# Patient Record
Sex: Male | Born: 1950
Health system: Southern US, Community
[De-identification: ages and names within clinical notes are randomized; demographics above are authoritative.]

## PROBLEM LIST (undated history)

## (undated) DIAGNOSIS — E119 Type 2 diabetes mellitus without complications: Secondary | ICD-10-CM

## (undated) DIAGNOSIS — T8859XA Other complications of anesthesia, initial encounter: Secondary | ICD-10-CM

## (undated) DIAGNOSIS — I10 Essential (primary) hypertension: Secondary | ICD-10-CM

## (undated) DIAGNOSIS — E7800 Pure hypercholesterolemia, unspecified: Secondary | ICD-10-CM

## (undated) DIAGNOSIS — J45909 Unspecified asthma, uncomplicated: Secondary | ICD-10-CM

## (undated) DIAGNOSIS — B1920 Unspecified viral hepatitis C without hepatic coma: Secondary | ICD-10-CM

## (undated) DIAGNOSIS — T4145XA Adverse effect of unspecified anesthetic, initial encounter: Secondary | ICD-10-CM

## (undated) DIAGNOSIS — K219 Gastro-esophageal reflux disease without esophagitis: Secondary | ICD-10-CM

## (undated) DIAGNOSIS — G4730 Sleep apnea, unspecified: Secondary | ICD-10-CM

## (undated) HISTORY — PX: OTHER SURGICAL HISTORY: SHX169

## (undated) HISTORY — DX: Gastro-esophageal reflux disease without esophagitis: K21.9

## (undated) HISTORY — PX: COLONOSCOPY: SHX174

## (undated) HISTORY — PX: CHOLECYSTECTOMY: SHX55

## (undated) HISTORY — DX: Unspecified asthma, uncomplicated: J45.909

## (undated) HISTORY — PX: BACK SURGERY: SHX140

## (undated) HISTORY — DX: Type 2 diabetes mellitus without complications: E11.9

---
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
## 2016-01-27 DIAGNOSIS — K7460 Unspecified cirrhosis of liver: Secondary | ICD-10-CM | POA: Insufficient documentation

## 2016-01-27 DIAGNOSIS — K8020 Calculus of gallbladder without cholecystitis without obstruction: Secondary | ICD-10-CM | POA: Insufficient documentation

## 2017-07-13 ENCOUNTER — Emergency Department (HOSPITAL_COMMUNITY)
Admission: EM | Admit: 2017-07-13 | Discharge: 2017-07-13 | Disposition: A | Discharge status: Home / Self Care | Payer: Medicare Other | Attending: Emergency Medicine | Admitting: Emergency Medicine

## 2017-07-13 ENCOUNTER — Encounter (HOSPITAL_COMMUNITY): Payer: Self-pay

## 2017-07-13 ENCOUNTER — Other Ambulatory Visit: Payer: Self-pay

## 2017-07-13 ENCOUNTER — Emergency Department (HOSPITAL_COMMUNITY): Payer: Medicare Other

## 2017-07-13 DIAGNOSIS — J9801 Acute bronchospasm: Secondary | ICD-10-CM | POA: Diagnosis not present

## 2017-07-13 DIAGNOSIS — I10 Essential (primary) hypertension: Secondary | ICD-10-CM | POA: Diagnosis present

## 2017-07-13 DIAGNOSIS — R030 Elevated blood-pressure reading, without diagnosis of hypertension: Secondary | ICD-10-CM

## 2017-07-13 DIAGNOSIS — Z8719 Personal history of other diseases of the digestive system: Secondary | ICD-10-CM | POA: Insufficient documentation

## 2017-07-13 HISTORY — DX: Essential (primary) hypertension: I10

## 2017-07-13 HISTORY — DX: Unspecified viral hepatitis C without hepatic coma: B19.20

## 2017-07-13 HISTORY — DX: Pure hypercholesterolemia, unspecified: E78.00

## 2017-07-13 HISTORY — DX: Sleep apnea, unspecified: G47.30

## 2017-07-13 LAB — CBC WITH DIFFERENTIAL/PLATELET
BASOS ABS: 0 10*3/uL (ref 0.0–0.1)
BASOS PCT: 1 %
EOS ABS: 0.1 10*3/uL (ref 0.0–0.7)
EOS PCT: 2 %
HCT: 44.7 % (ref 39.0–52.0)
Hemoglobin: 14.9 g/dL (ref 13.0–17.0)
Lymphocytes Relative: 25 %
Lymphs Abs: 2 10*3/uL (ref 0.7–4.0)
MCH: 28.5 pg (ref 26.0–34.0)
MCHC: 33.3 g/dL (ref 30.0–36.0)
MCV: 85.5 fL (ref 78.0–100.0)
MONO ABS: 0.6 10*3/uL (ref 0.1–1.0)
Monocytes Relative: 8 %
Neutro Abs: 5.3 10*3/uL (ref 1.7–7.7)
Neutrophils Relative %: 65 %
PLATELETS: 112 10*3/uL — AB (ref 150–400)
RBC: 5.23 MIL/uL (ref 4.22–5.81)
RDW: 14 % (ref 11.5–15.5)
WBC: 8.1 10*3/uL (ref 4.0–10.5)

## 2017-07-13 LAB — BRAIN NATRIURETIC PEPTIDE: B NATRIURETIC PEPTIDE 5: 30.6 pg/mL (ref 0.0–100.0)

## 2017-07-13 LAB — COMPREHENSIVE METABOLIC PANEL
ALBUMIN: 3.3 g/dL — AB (ref 3.5–5.0)
ALT: 23 U/L (ref 17–63)
AST: 23 U/L (ref 15–41)
Alkaline Phosphatase: 128 U/L — ABNORMAL HIGH (ref 38–126)
Anion gap: 6 (ref 5–15)
BUN: 12 mg/dL (ref 6–20)
CHLORIDE: 106 mmol/L (ref 101–111)
CO2: 26 mmol/L (ref 22–32)
Calcium: 9 mg/dL (ref 8.9–10.3)
Creatinine, Ser: 0.82 mg/dL (ref 0.61–1.24)
GFR calc Af Amer: >60 mL/min (ref >60)
Glucose, Bld: 94 mg/dL (ref 65–99)
POTASSIUM: 3.6 mmol/L (ref 3.5–5.1)
Sodium: 138 mmol/L (ref 135–145)
Total Bilirubin: 0.6 mg/dL (ref 0.3–1.2)
Total Protein: 6.6 g/dL (ref 6.5–8.1)

## 2017-07-13 LAB — TROPONIN I: Troponin I: <0.03 ng/mL (ref <0.03)

## 2017-07-13 MED ORDER — PREDNISONE 20 MG PO TABS: ORAL_TABLET | ORAL | 0 refills | Status: DC

## 2017-07-13 MED ORDER — ALBUTEROL SULFATE HFA 108 (90 BASE) MCG/ACT IN AERS
1.0000 | INHALATION_SPRAY | RESPIRATORY_TRACT | Status: DC | PRN
  Filled 2017-07-13: qty 6.7

## 2017-07-13 NOTE — ED Notes (Signed)
Lab contacted about BNP not yet being resulted. Lab states they will run at this time

## 2017-07-13 NOTE — Discharge Instructions (Signed)
Take your blood pressure medication as it is prescribed.  Follow-up closely with your primary physician or establish care with a primary care physician in the area.  Return immediately for any worsening of your symptoms.

## 2017-07-13 NOTE — ED Provider Notes (Signed)
[name] EMERGENCY DEPARTMENT Provider Note   CSN: [id number] Arrival date & time: 07/13/17  7829     History   Chief Complaint Chief Complaint  Patient presents with  . Hypertension    HPI Jesse Guerrero is a 66 y.o. male.  HPI Patient's initial complaint is elevated blood pressure.  States he has been taking his metoprolol only once rather than twice a day as it is prescribed.  Says his systolic blood pressure was greater than 200 this morning.  Took double his dose of metoprolol.  Denies any headache or lightheadedness.  He has had increased shortness of breath over the last few weeks.  He noticed increased swelling to bilateral lower extremities.  Denies any chest pain.  No fever or chills.  Shortness of breath is exacerbated with minor exertion.  States he is from [place] and does not have a primary physician locally. Past Medical History:  Diagnosis Date  . Hepatitis C   . High cholesterol   . Hypertension   . Sleep apnea     There are no active problems to display for this patient.   Past Surgical History:  Procedure Laterality Date  . BACK SURGERY    . CHOLECYSTECTOMY         Home Medications    Prior to Admission medications   Medication Sig Start Date End Date Taking? Authorizing Provider  predniSONE (DELTASONE) 20 MG tablet 3 tabs po day one, then 2 po daily x 4 days 07/13/17   Julianne Rice, MD    Family History No family history on file.  Social History Social History   Tobacco Use  . Smoking status: Former Research scientist (life sciences)  . Smokeless tobacco: Never Used  Substance Use Topics  . Alcohol use: Yes    Comment: Occasionally   . Drug use: No     Allergies   Patient has no known allergies.   Review of Systems Review of Systems  Constitutional: Negative for chills, fatigue and fever.  Eyes: Negative for visual disturbance.  Respiratory: Positive for shortness of breath. Negative for cough.   Cardiovascular: Positive for  leg swelling. Negative for chest pain and palpitations.  Gastrointestinal: Negative for abdominal pain, diarrhea, nausea and vomiting.  Musculoskeletal: Negative for back pain, myalgias, neck pain and neck stiffness.  Skin: Negative for rash and wound.  Neurological: Negative for dizziness, weakness, light-headedness, numbness and headaches.  All other systems reviewed and are negative.    Physical Exam Updated Vital Signs BP (!) 135/57   Pulse (!) 47   Temp 98.2 F (36.8 C) (Oral)   Resp 16   Ht 6' (1.829 m)   Wt 136.1 kg (300 lb)   SpO2 96%   BMI 40.69 kg/m   Physical Exam  Constitutional: He is oriented to person, place, and time. He appears well-developed and well-nourished.  HENT:  Head: Normocephalic and atraumatic.  Mouth/Throat: Oropharynx is clear and moist. No oropharyngeal exudate.  Eyes: EOM are normal. Pupils are equal, round, and reactive to light.  Neck: Normal range of motion. Neck supple.  Cardiovascular: Regular rhythm.  Bradycardia  Pulmonary/Chest: Effort normal and breath sounds normal.  Diminished breath sounds bilateral bases with expiratory wheezes.  Abdominal: Soft. Bowel sounds are normal. There is no tenderness. There is no rebound and no guarding.  Ventral hernia that is easily reduced and nontender.  Musculoskeletal: Normal range of motion. He exhibits edema. He exhibits no tenderness.  2+ bilateral lower extremity edema without asymmetry.  Neurological:  He is alert and oriented to person, place, and time.  Moves all extremities without focal deficit.  Sensation fully intact.  Skin: Skin is warm and dry. Capillary refill takes less than 2 seconds. No rash noted. No erythema.  Psychiatric: He has a normal mood and affect. His behavior is normal.  Nursing note and vitals reviewed.    ED Treatments / Results  Labs (all labs ordered are listed, but only abnormal results are displayed) Labs Reviewed  CBC WITH DIFFERENTIAL/PLATELET - Abnormal;  Notable for the following components:      Result Value   Platelets 112 (*)    All other components within normal limits  COMPREHENSIVE METABOLIC PANEL - Abnormal; Notable for the following components:   Albumin 3.3 (*)    Alkaline Phosphatase 128 (*)    All other components within normal limits  TROPONIN I  BRAIN NATRIURETIC PEPTIDE    EKG  EKG Interpretation  Date/Time:  Monday July 13 2017 13:05:49 EST Ventricular Rate:  48 PR Interval:    QRS Duration: 97 QT Interval:  441 QTC Calculation: 394 R Axis:   1 Text Interpretation:  Sinus bradycardia Anteroseptal infarct, age indeterminate Confirmed by Julianne Rice [phone]) on 07/13/2017 1:47:15 PM       Radiology Dg Chest 2 View  Result Date: 07/13/2017 CLINICAL DATA:  Shortness of breath. Elevated blood pressure. Former smoker. EXAM: CHEST  2 VIEW COMPARISON:  None. FINDINGS: The cardiac silhouette is mildly enlarged. There is peribronchial thickening with mild diffuse interstitial accentuation. No Kerley lines, confluent airspace opacity, pleural effusion, or pneumothorax is identified. No acute osseous abnormality is seen. IMPRESSION: Bronchitic changes.  No alveolar edema or focal pneumonia. Electronically Signed   By: Logan Bores M.D.   On: 07/13/2017 14:10    Procedures Procedures (including critical care time)  Medications Ordered in ED Medications  albuterol (PROVENTIL HFA;VENTOLIN HFA) 108 (90 Base) MCG/ACT inhaler 1-2 puff (not administered)     Initial Impression / Assessment and Plan / ED Course  I have reviewed the triage vital signs and the nursing notes.  Pertinent labs & imaging results that were available during my care of the patient were reviewed by me and considered in my medical decision making (see chart for details).    Patient shortness of breath is likely due to bronchospasm.  Chest x-ray without edema or focal pneumonia.  Workup otherwise is normal.  Advised to take his blood pressure  medications as they are prescribed and  to follow-up closely with his primary physician and return precautions given.   Final Clinical Impressions(s) / ED Diagnoses   Final diagnoses:  Bronchospasm  Transient elevated blood pressure    ED Discharge Orders        Ordered    predniSONE (DELTASONE) 20 MG tablet     07/13/17 1521       Julianne Rice, MD 07/13/17 [phone]

## 2017-07-13 NOTE — ED Notes (Signed)
Patient transported to X-ray 

## 2017-07-13 NOTE — ED Notes (Signed)
Pt ambulatory to room with no difficulty. NAD, VSS.

## 2017-07-13 NOTE — ED Triage Notes (Signed)
Per Pt, Pt is coming from home with complaints of HTN. Increasing over 6 weeks. He has been doubling up on his dose at home and taking two doses at one time instead of one dose twice a day. Reports his wife is a stage four cancer patient and he has been taking care of her at home. Pt has noticed that his BP has been increasing slowly recently.

## 2018-02-16 ENCOUNTER — Ambulatory Visit: Payer: 59 | Admitting: Endocrinology

## 2018-04-08 ENCOUNTER — Encounter: Payer: Self-pay | Admitting: Endocrinology

## 2018-04-08 ENCOUNTER — Ambulatory Visit (INDEPENDENT_AMBULATORY_CARE_PROVIDER_SITE_OTHER): Payer: Medicare Other | Admitting: Endocrinology

## 2018-04-08 DIAGNOSIS — Z794 Long term (current) use of insulin: Secondary | ICD-10-CM

## 2018-04-08 DIAGNOSIS — E1142 Type 2 diabetes mellitus with diabetic polyneuropathy: Secondary | ICD-10-CM | POA: Diagnosis not present

## 2018-04-08 DIAGNOSIS — E119 Type 2 diabetes mellitus without complications: Secondary | ICD-10-CM | POA: Insufficient documentation

## 2018-04-08 MED ORDER — INSULIN REGULAR HUMAN 100 UNIT/ML IJ SOLN: INTRAMUSCULAR | 11 refills | Status: DC

## 2018-04-08 NOTE — Progress Notes (Signed)
Subjective:    Patient ID: Jesse Guerrero, male    DOB: Mar 30, 1951, 67 y.o.   MRN: [id number]  HPI pt is referred by Dr Bertrum Sol, for diabetes.  Pt states DM was dx'ed in 1997; he has moderate neuropathy of the lower extremities, and assoc pain; he also has nephropathy; he has been on insulin since 2004, and pump rx since 2007; pt says his diet and exercise are poor; he has never had pancreatitis, pancreatic surgery, or DKA.  Last episode of severe hypoglycemia in 2012.  He has some depression, because wife recently died.  He has a medtronic minimed pump, which is at least 67 years old.   He takes these settings (U-500 insulin, so numbers below are multiplied by 5, to arrive at actual insulin dosage):  basal rate of 2 units/hr, except for 1.2 units/hr, 17:30-07:30.  bolus of 1 unit/10 grams carbohydrate continue correction bolus (which some people call "sensitivity," or "insulin sensitivity ratio," or just "isr") of 1 unit for each 45 by which your glucose exceeds 100.  However, 2 mos ago, he started taking the pump off during the day, due to the expense of the insulin.   TDD is approx 40 units.    He wants to change to a cheaper insulin.   Past Medical History:  Diagnosis Date  . Hepatitis C   . High cholesterol   . Hypertension   . Sleep apnea     Past Surgical History:  Procedure Laterality Date  . BACK SURGERY    . CHOLECYSTECTOMY      Social History   Socioeconomic History  . Marital status: Married    Spouse name: Not on file  . Number of children: Not on file  . Years of education: Not on file  . Highest education level: Not on file  Occupational History  . Not on file  Social Needs  . Financial resource strain: Not on file  . Food insecurity:    Worry: Not on file    Inability: Not on file  . Transportation needs:    Medical: Not on file    Non-medical: Not on file  Tobacco Use  . Smoking status: Former Research scientist (life sciences)  . Smokeless tobacco: Never Used  Substance and  Sexual Activity  . Alcohol use: Yes    Comment: Occasionally   . Drug use: No  . Sexual activity: Not on file  Lifestyle  . Physical activity:    Days per week: Not on file    Minutes per session: Not on file  . Stress: Not on file  Relationships  . Social connections:    Talks on phone: Not on file    Gets together: Not on file    Attends religious service: Not on file    Active member of club or organization: Not on file    Attends meetings of clubs or organizations: Not on file    Relationship status: Not on file  . Intimate partner violence:    Fear of current or ex partner: Not on file    Emotionally abused: Not on file    Physically abused: Not on file    Forced sexual activity: Not on file  Other Topics Concern  . Not on file  Social History Narrative  . Not on file    Current Outpatient Medications on File Prior to Visit  Medication Sig Dispense Refill  . aspirin EC 81 MG tablet Take by mouth.    Marland Kitchen atorvastatin (LIPITOR) 40 MG  tablet Take by mouth.    . DOCOSAHEXAENOIC ACID PO Take 1 g by mouth.    . DULoxetine (CYMBALTA) 60 MG capsule Take by mouth.    . esomeprazole (NEXIUM) 20 MG packet Take by mouth.    . gabapentin (NEURONTIN) 300 MG capsule Take by mouth.    . hydrochlorothiazide (MICROZIDE) 12.5 MG capsule Take by mouth.    Marland Kitchen lisinopril (PRINIVIL,ZESTRIL) 20 MG tablet Take by mouth.    . metFORMIN (GLUCOPHAGE) 1000 MG tablet Take by mouth.    . metoprolol tartrate (LOPRESSOR) 50 MG tablet Take by mouth.     No current facility-administered medications on file prior to visit.     No Known Allergies  Family History  Problem Relation Age of Onset  . Diabetes Father     BP (!) 190/102 (BP Location: Left Arm, Patient Position: Sitting, Cuff Size: Large)   Pulse 85   Ht 6' (1.829 m)   Wt 269 lb 9.6 oz (122.3 kg)   SpO2 97%   BMI 36.56 kg/m     Review of Systems denies blurry vision, headache, chest pain, sob, n/v, muscle cramps, excessive  diaphoresis, memory loss, cold intolerance, rhinorrhea, and easy bruising.  He has lost 59 lbs x 8 months.  He has frequent urination.      Objective:   Physical Exam VS: see vs page GEN: no distress HEAD: head: no deformity eyes: no periorbital swelling, no proptosis external nose and ears are normal mouth: no lesion seen NECK: supple, thyroid is not enlarged CHEST WALL: no deformity.   LUNGS: clear to auscultation CV: reg rate and rhythm, no murmur ABD: abdomen is soft, nontender.  no hepatosplenomegaly.  not distended.  no hernia.  Pump infusion site is normal MUSCULOSKELETAL: muscle bulk and strength are grossly normal.  no obvious joint swelling.  gait is normal and steady EXTEMITIES: no deformity.  no ulcer on the feet.  feet are of normal color and temp.  no edema PULSES: dorsalis pedis intact bilat.  no carotid bruit NEURO:  cn 2-12 grossly intact.   readily moves all 4's.  sensation is intact to touch on the feet, but decreased from normal SKIN:  Normal texture and temperature.  No rash or suspicious lesion is visible.   NODES:  None palpable at the neck.   PSYCH: alert, well-oriented.  Does not appear anxious nor depressed.    Lab Results  Component Value Date   CREATININE 0.82 07/13/2017   BUN 12 07/13/2017   NA 138 07/13/2017   K 3.6 07/13/2017   CL 106 07/13/2017   CO2 26 07/13/2017   outside test results are reviewed: A1c=8.0%  I have reviewed outside records, and summarized: Pt was noted to have elevated a1c, and referred here.  Other probs addressed were dyslipidemia and HTN.        Assessment & Plan:  Type 1 DM, with polyneuropathy, new to me: he needs increased rx  Patient Instructions  Your blood pressure is high today.  Please see your primary care provider soon, to have it rechecked good diet and exercise significantly improve the control of your diabetes.  please let me know if you wish to be referred to a dietician.  high blood sugar is very risky  to your health.  you should see an eye doctor and dentist every year.  It is very important to get all recommended vaccinations.   Controlling your blood pressure and cholesterol drastically reduces the damage diabetes does to your  body.  Those who smoke should quit.  Please discuss these with your doctor.  check your blood sugar 4 times a day.  vary the time of day when you check, between before the 3 meals, and at bedtime.  also check if you have symptoms of your blood sugar being too high or too low.  please keep a record of the readings and bring it to your next appointment here (or you can bring the meter itself).  You can write it on any piece of paper.  please call us sooner if your blood sugar goes below 70, or if you have a lot of readings over 200. I have sent a prescription to your pharmacy, to change to [facility] regular insulin.  Please take these settings: basal rate of 2 units/hr, except for 1.2 units/hr, 17:30-07:30.  bolus of 1 unit/10 grams carbohydrate continue correction bolus (which some people call "sensitivity," or "insulin sensitivity ratio," or just "isr") of 1 unit for each 45 by which your glucose exceeds 100.  When you use up your U-500, please change to these settings, with the U-100 insulin from [employer]: basal rate of 10 units/hr, except for 6 units/hr, 17:30-07:30.  bolus of 1 unit/2 grams carbohydrate continue correction bolus (which some people call "sensitivity," or "insulin sensitivity ratio," or just "isr") of 1 unit for each 9 by which your glucose exceeds 100.  Please come back for a follow-up appointment in 2 months.  Please see the Vaughan Basta the same day.

## 2018-04-08 NOTE — Patient Instructions (Addendum)
Your blood pressure is high today.  Please see your primary care provider soon, to have it rechecked good diet and exercise significantly improve the control of your diabetes.  please let me know if you wish to be referred to a dietician.  high blood sugar is very risky to your health.  you should see an eye doctor and dentist every year.  It is very important to get all recommended vaccinations.   Controlling your blood pressure and cholesterol drastically reduces the damage diabetes does to your body.  Those who smoke should quit.  Please discuss these with your doctor.  check your blood sugar 4 times a day.  vary the time of day when you check, between before the 3 meals, and at bedtime.  also check if you have symptoms of your blood sugar being too high or too low.  please keep a record of the readings and bring it to your next appointment here (or you can bring the meter itself).  You can write it on any piece of paper.  please call us sooner if your blood sugar goes below 70, or if you have a lot of readings over 200. I have sent a prescription to your pharmacy, to change to [facility] regular insulin.  Please take these settings: basal rate of 2 units/hr, except for 1.2 units/hr, 17:30-07:30.  bolus of 1 unit/10 grams carbohydrate continue correction bolus (which some people call "sensitivity," or "insulin sensitivity ratio," or just "isr") of 1 unit for each 45 by which your glucose exceeds 100.  When you use up your U-500, please change to these settings, with the U-100 insulin from [employer]: basal rate of 10 units/hr, except for 6 units/hr, 17:30-07:30.  bolus of 1 unit/2 grams carbohydrate continue correction bolus (which some people call "sensitivity," or "insulin sensitivity ratio," or just "isr") of 1 unit for each 9 by which your glucose exceeds 100.  Please come back for a follow-up appointment in 2 months.  Please see the Vaughan Basta the same day.

## 2018-05-31 ENCOUNTER — Ambulatory Visit (INDEPENDENT_AMBULATORY_CARE_PROVIDER_SITE_OTHER): Payer: Medicare Other | Admitting: Endocrinology

## 2018-05-31 VITALS — BP 148/76 | HR 54 | Ht 72.0 in | Wt 280.0 lb

## 2018-05-31 DIAGNOSIS — E1142 Type 2 diabetes mellitus with diabetic polyneuropathy: Secondary | ICD-10-CM

## 2018-05-31 DIAGNOSIS — Z794 Long term (current) use of insulin: Secondary | ICD-10-CM

## 2018-05-31 LAB — POCT GLYCOSYLATED HEMOGLOBIN (HGB A1C): HEMOGLOBIN A1C: 7.8 % — AB (ref 4.0–5.6)

## 2018-05-31 NOTE — Patient Instructions (Addendum)
Your blood pressure is high today.  Please see your primary care provider soon, to have it rechecked check your blood sugar 4 times a day.  vary the time of day when you check, between before the 3 meals, and at bedtime.  also check if you have symptoms of your blood sugar being too high or too low.  please keep a record of the readings and bring it to your next appointment here (or you can bring the meter itself).  You can write it on any piece of paper.  please call us sooner if your blood sugar goes below 70, or if you have a lot of readings over 200. I have sent a prescription to your pharmacy, to change to [facility] regular insulin.  Please take these settings: basal rate of 2 units/hr, except for 1.2 units/hr, 17:30-07:30.  bolus of 1 unit/10 grams carbohydrate correction bolus (which some people call "sensitivity," or "insulin sensitivity ratio," or just "isr") of 1 unit for each 45 by which your glucose exceeds 100.  When you use up your U-500, please change to these settings, with the U-100 insulin from [employer]: basal rate of 10 units/hr, except for 6 units/hr, 17:30-07:30.  bolus of 1 unit/2 grams carbohydrate.   continue correction bolus (which some people call "sensitivity," or "insulin sensitivity ratio," or just "isr") of 1 unit for each 9 by which your glucose exceeds 100.  Please come back for a follow-up appointment in 3 months.  Please see the Vaughan Basta, to address pump settings.

## 2018-05-31 NOTE — Progress Notes (Signed)
Subjective:    Patient ID: Jesse Guerrero, male    DOB: [birth year], 67 y.o.   MRN: [id number]  HPI Pt returns for f/u of diabetes mellitus: DM type: Insulin-requiring type 2.   Dx'ed: 3559 Complications: polyneuropathy and nephropathy Therapy: insulin since 2004 DKA: never Severe hypoglycemia: last episode of severe hypoglycemia in 2012 Pancreatitis: never Pancreatic imaging: normal on 2017 CT Other: he has a medtronic minimed pump Interval history: pt is referred by Dr Bertrum Sol, for diabetes.  Pt states DM was dx'ed in 1997; he has moderate ; he has been on insulin since 2004, and pump rx since 2007; pt says his diet and exercise are poor; he has never had pancreatitis, pancreatic surgery, or DKA.  L.  He has some depression, because wife recently died. , which is at least 56 years old.   He takes these settings (U-500 insulin, so numbers below are multiplied by 5, to arrive at actual insulin dosage):  TDD is approx 200 units.    basal rate of 10 units/hr, except for 6 units/hr, 17:30-07:30.  bolus of 1 unit/2 grams carbohydrate.   correction bolus (which some people call "sensitivity," or "insulin sensitivity ratio," or just "isr") of 1 unit for each 9 by which your glucose exceeds 100.   Meter is downloaded today, and the printout is scanned into the record.  cbg varies from 65-400. It is in general higher as the day goes on.    Pt says pump is reading a time of 22:25 (actual time is 10:25 AM).  He is still using up U-500 reg insulin.   Past Medical History:  Diagnosis Date  . Hepatitis C   . High cholesterol   . Hypertension   . Sleep apnea     Past Surgical History:  Procedure Laterality Date  . BACK SURGERY    . CHOLECYSTECTOMY      Social History   Socioeconomic History  . Marital status: Married    Spouse name: Not on file  . Number of children: Not on file  . Years of education: Not on file  . Highest education level: Not on file  Occupational History  . Not on  file  Social Needs  . Financial resource strain: Not on file  . Food insecurity:    Worry: Not on file    Inability: Not on file  . Transportation needs:    Medical: Not on file    Non-medical: Not on file  Tobacco Use  . Smoking status: Former Research scientist (life sciences)  . Smokeless tobacco: Never Used  Substance and Sexual Activity  . Alcohol use: Yes    Comment: Occasionally   . Drug use: No  . Sexual activity: Not on file  Lifestyle  . Physical activity:    Days per week: Not on file    Minutes per session: Not on file  . Stress: Not on file  Relationships  . Social connections:    Talks on phone: Not on file    Gets together: Not on file    Attends religious service: Not on file    Active member of club or organization: Not on file    Attends meetings of clubs or organizations: Not on file    Relationship status: Not on file  . Intimate partner violence:    Fear of current or ex partner: Not on file    Emotionally abused: Not on file    Physically abused: Not on file    Forced sexual activity: Not  on file  Other Topics Concern  . Not on file  Social History Narrative  . Not on file    Current Outpatient Medications on File Prior to Visit  Medication Sig Dispense Refill  . aspirin EC 81 MG tablet Take by mouth.    Marland Kitchen atorvastatin (LIPITOR) 40 MG tablet Take by mouth.    . esomeprazole (NEXIUM) 20 MG packet Take by mouth.    . gabapentin (NEURONTIN) 300 MG capsule Take by mouth.    . hydrochlorothiazide (MICROZIDE) 12.5 MG capsule Take by mouth.    . insulin regular (HUMULIN R) 100 units/mL injection For use in pump, total of 200 units/day. 10 mL 11  . lisinopril (PRINIVIL,ZESTRIL) 20 MG tablet Take by mouth.    . metFORMIN (GLUCOPHAGE) 1000 MG tablet Take by mouth.    . metoprolol tartrate (LOPRESSOR) 50 MG tablet Take by mouth.    . DOCOSAHEXAENOIC ACID PO Take 1 g by mouth.    . DULoxetine (CYMBALTA) 60 MG capsule Take by mouth.     No current facility-administered medications  on file prior to visit.     No Known Allergies  Family History  Problem Relation Age of Onset  . Diabetes Father     BP (!) 148/76 (BP Location: Right Arm, Patient Position: Sitting, Cuff Size: Large)   Pulse (!) 54   Ht 6' (1.829 m)   Wt 280 lb (127 kg)   SpO2 93%   BMI 37.97 kg/m   Review of Systems He has gained weight.      Objective:   Physical Exam VITAL SIGNS:  See vs page GENERAL: no distress Pulses: dorsalis pedis intact bilat.   MSK: no deformity of the feet CV: no leg edema Skin:  no ulcer on the feet.  normal color and temp on the feet. Neuro: sensation is intact to touch on the feet, but decreased from normal.     Lab Results  Component Value Date   HGBA1C 7.8 (A) 05/31/2018      Assessment & Plan:  Insulin-requiring type 2 DM: therapy is limited by inversion of AM and PM on pump.   Patient Instructions  Your blood pressure is high today.  Please see your primary care provider soon, to have it rechecked check your blood sugar 4 times a day.  vary the time of day when you check, between before the 3 meals, and at bedtime.  also check if you have symptoms of your blood sugar being too high or too low.  please keep a record of the readings and bring it to your next appointment here (or you can bring the meter itself).  You can write it on any piece of paper.  please call us sooner if your blood sugar goes below 70, or if you have a lot of readings over 200. I have sent a prescription to your pharmacy, to change to [facility] regular insulin.  Please take these settings: basal rate of 2 units/hr, except for 1.2 units/hr, 17:30-07:30.  bolus of 1 unit/10 grams carbohydrate correction bolus (which some people call "sensitivity," or "insulin sensitivity ratio," or just "isr") of 1 unit for each 45 by which your glucose exceeds 100.  When you use up your U-500, please change to these settings, with the U-100 insulin from [facility]: basal rate of 10 units/hr, except for  6 units/hr, 17:30-07:30.  bolus of 1 unit/2 grams carbohydrate.   continue correction bolus (which some people call "sensitivity," or "insulin sensitivity ratio," or just "  isr") of 1 unit for each 9 by which your glucose exceeds 100.  Please come back for a follow-up appointment in 3 months.  Please see the Vaughan Basta, to address pump settings.

## 2018-06-01 ENCOUNTER — Ambulatory Visit: Payer: 59 | Admitting: Endocrinology

## 2018-06-01 ENCOUNTER — Encounter: Payer: 59 | Admitting: Nutrition

## 2018-07-05 ENCOUNTER — Telehealth: Payer: Self-pay | Admitting: Nutrition

## 2018-07-05 ENCOUNTER — Encounter: Payer: Medicare Other | Admitting: Nutrition

## 2018-07-05 NOTE — Telephone Encounter (Signed)
Patient was to have his pump settings changed, because he was switching to U100 insulin.  He reports that he has at least 2 more months of U 500 R insulin left.  We decided that he would call me when he runs out of insulin.  He asked me to check his blood pressure, because he has been running "very high", even after doubling his BP medications.  Says it was 220 this AM over 110.  I checked it, and it was 190/70.  I waited 10 min. And it was 164/74.  He was told to call his doctor and tell him that he has doubled his BP medication, and what his readings have been doing since he has done this.  He agreed to do this.  We also dicussed low sodium food choices and how to read labels to tell if it is high in sodium.

## 2018-07-05 NOTE — Progress Notes (Signed)
See telephone encounter.

## 2018-07-06 ENCOUNTER — Emergency Department (HOSPITAL_COMMUNITY)
Admission: EM | Admit: 2018-07-06 | Discharge: 2018-07-06 | Disposition: A | Discharge status: Home / Self Care | Payer: Medicare Other | Attending: Emergency Medicine | Admitting: Emergency Medicine

## 2018-07-06 ENCOUNTER — Emergency Department (HOSPITAL_COMMUNITY): Payer: Medicare Other

## 2018-07-06 ENCOUNTER — Encounter (HOSPITAL_COMMUNITY): Payer: Self-pay | Admitting: Emergency Medicine

## 2018-07-06 DIAGNOSIS — Z794 Long term (current) use of insulin: Secondary | ICD-10-CM | POA: Diagnosis not present

## 2018-07-06 DIAGNOSIS — Z87891 Personal history of nicotine dependence: Secondary | ICD-10-CM | POA: Diagnosis not present

## 2018-07-06 DIAGNOSIS — Z7982 Long term (current) use of aspirin: Secondary | ICD-10-CM | POA: Insufficient documentation

## 2018-07-06 DIAGNOSIS — E119 Type 2 diabetes mellitus without complications: Secondary | ICD-10-CM | POA: Diagnosis not present

## 2018-07-06 DIAGNOSIS — Z79899 Other long term (current) drug therapy: Secondary | ICD-10-CM | POA: Insufficient documentation

## 2018-07-06 DIAGNOSIS — I10 Essential (primary) hypertension: Secondary | ICD-10-CM | POA: Diagnosis present

## 2018-07-06 LAB — CBC WITH DIFFERENTIAL/PLATELET
Abs Immature Granulocytes: 0.04 10*3/uL (ref 0.00–0.07)
BASOS ABS: 0.1 10*3/uL (ref 0.0–0.1)
Basophils Relative: 1 %
EOS ABS: 0.1 10*3/uL (ref 0.0–0.5)
Eosinophils Relative: 2 %
HCT: 49.5 % (ref 39.0–52.0)
Hemoglobin: 15.4 g/dL (ref 13.0–17.0)
IMMATURE GRANULOCYTES: 1 %
Lymphocytes Relative: 24 %
Lymphs Abs: 2.1 10*3/uL (ref 0.7–4.0)
MCH: 26.7 pg (ref 26.0–34.0)
MCHC: 31.1 g/dL (ref 30.0–36.0)
MCV: 85.9 fL (ref 80.0–100.0)
Monocytes Absolute: 0.9 10*3/uL (ref 0.1–1.0)
Monocytes Relative: 10 %
NEUTROS PCT: 62 %
NRBC: 0 % (ref 0.0–0.2)
Neutro Abs: 5.6 10*3/uL (ref 1.7–7.7)
Platelets: 148 10*3/uL — ABNORMAL LOW (ref 150–400)
RBC: 5.76 MIL/uL (ref 4.22–5.81)
RDW: 13.6 % (ref 11.5–15.5)
WBC: 8.9 10*3/uL (ref 4.0–10.5)

## 2018-07-06 LAB — BASIC METABOLIC PANEL
Anion gap: 9 (ref 5–15)
BUN: 11 mg/dL (ref 8–23)
CALCIUM: 9.1 mg/dL (ref 8.9–10.3)
CO2: 21 mmol/L — ABNORMAL LOW (ref 22–32)
CREATININE: 0.72 mg/dL (ref 0.61–1.24)
Chloride: 105 mmol/L (ref 98–111)
GFR calc non Af Amer: >60 mL/min (ref >60)
Glucose, Bld: 118 mg/dL — ABNORMAL HIGH (ref 70–99)
Potassium: 4.7 mmol/L (ref 3.5–5.1)
SODIUM: 135 mmol/L (ref 135–145)

## 2018-07-06 LAB — BRAIN NATRIURETIC PEPTIDE: B Natriuretic Peptide: 54.9 pg/mL (ref 0.0–100.0)

## 2018-07-06 LAB — TROPONIN I

## 2018-07-06 MED ORDER — LISINOPRIL 30 MG PO TABS: 30.0000 mg | ORAL_TABLET | Freq: Every day | ORAL | 0 refills | Status: DC

## 2018-07-06 MED ORDER — FUROSEMIDE 20 MG PO TABS: 20.0000 mg | ORAL_TABLET | Freq: Every day | ORAL | 0 refills | Status: DC

## 2018-07-06 MED ORDER — POTASSIUM CHLORIDE CRYS ER 20 MEQ PO TBCR
20.0000 meq | EXTENDED_RELEASE_TABLET | Freq: Once | ORAL | Status: AC
  Administered 2018-07-06: 20 meq via ORAL
  Filled 2018-07-06: qty 1

## 2018-07-06 MED ORDER — FUROSEMIDE 10 MG/ML IJ SOLN
20.0000 mg | Freq: Once | INTRAMUSCULAR | Status: AC
  Administered 2018-07-06: 20 mg via INTRAVENOUS
  Filled 2018-07-06: qty 2

## 2018-07-06 MED ORDER — LISINOPRIL 10 MG PO TABS
10.0000 mg | ORAL_TABLET | Freq: Once | ORAL | Status: AC
  Administered 2018-07-06: 10 mg via ORAL
  Filled 2018-07-06: qty 1

## 2018-07-06 NOTE — ED Provider Notes (Signed)
[facility] EMERGENCY DEPARTMENT Provider Note   CSN: [id number] Arrival date & time: 07/06/18  1602     History   Chief Complaint Chief Complaint  Patient presents with  . Hypertension  . Shortness of Breath    HPI Jesse Guerrero is a 67 y.o. male.  HPI Pt has been feeling short of breath since last Tuesday.  He has not seen anyone for it since it started.   Today he took his blood pressure and it has been running high despite him taking extra metoprolol and hydralazine.  He does notice he gets short of breath if he walks up the stairs or walking across the parking lot.  He also has trouble at night when using cpap. No chest pain.  No fevers or cough.  No leg swelling.  [name].  He has been smoking some recently.  Past Medical History:  Diagnosis Date  . Hepatitis C   . High cholesterol   . Hypertension   . Sleep apnea     Patient Active Problem List   Diagnosis Date Noted  . Diabetes ([place]) 04/08/2018    Past Surgical History:  Procedure Laterality Date  . BACK SURGERY    . CHOLECYSTECTOMY          Home Medications    Prior to Admission medications   Medication Sig Start Date End Date Taking? Authorizing Provider  aspirin EC 81 MG tablet Take 81 mg by mouth at bedtime.    Yes [provider]  atorvastatin (LIPITOR) 40 MG tablet Take 40 mg by mouth at bedtime.    Yes [provider]  DOCOSAHEXAENOIC ACID PO Take 2 g by mouth at bedtime.    Yes [provider]  esomeprazole (NEXIUM) 20 MG capsule Take 20 mg by mouth at bedtime.    Yes [provider]  gabapentin (NEURONTIN) 300 MG capsule Take 600-900 mg by mouth See admin instructions. Take 600 mg by mouth in the morning and 900 mg at bedtime   Yes [provider]  hydrALAZINE (APRESOLINE) 25 MG tablet Take 25 mg by mouth 3 (three) times daily as needed (for a B/P >180/100).   Yes [provider]    hydrochlorothiazide (HYDRODIURIL) 12.5 MG tablet Take 12.5 mg by mouth at bedtime.   Yes [provider]  insulin regular human CONCENTRATED (HUMULIN R) 500 UNIT/ML injection Inject into the skin See admin instructions. Per insulin pump   Yes [provider]  metFORMIN (GLUCOPHAGE) 1000 MG tablet Take 1,000 mg by mouth 2 (two) times daily with a meal.    Yes [provider]  metoprolol tartrate (LOPRESSOR) 25 MG tablet Take 25 mg by mouth 2 (two) times daily.   Yes [provider]  furosemide (LASIX) 20 MG tablet Take 1 tablet (20 mg total) by mouth daily for 5 days. 07/06/18 07/11/18  [name], MD  insulin regular (HUMULIN R) 100 units/mL injection For use in pump, total of 200 units/day. 04/08/18   [name], MD  lisinopril (PRINIVIL,ZESTRIL) 30 MG tablet Take 1 tablet (30 mg total) by mouth at bedtime. 07/06/18   [name], MD    Family History Family History  Problem Relation Age of Onset  . Diabetes Father     Social History Social History   Tobacco Use  . Smoking status: Former Research scientist (life sciences)  . Smokeless tobacco: Never Used  Substance Use Topics  . Alcohol use: Yes    Comment:  Occasionally   . Drug use: No     Allergies   Patient has no known allergies.   Review of Systems Review of Systems  All other systems reviewed and are negative.    Physical Exam Updated Vital Signs BP (!) 193/85 (BP Location: Right Arm)   Pulse (!) 52   Temp 98.1 F (36.7 C) (Oral)   Resp 18   SpO2 98%   Physical Exam  Constitutional: He appears well-developed and well-nourished. No distress.  HENT:  Head: Normocephalic and atraumatic.  Right Ear: External ear normal.  Left Ear: External ear normal.  Eyes: Conjunctivae are normal. Right eye exhibits no discharge. Left eye exhibits no discharge. No scleral icterus.  Neck: Neck supple. No tracheal deviation present.  Cardiovascular: Normal rate, regular rhythm and intact distal pulses.   Pulmonary/Chest: Effort normal. No stridor. No respiratory distress. He has no wheezes. He has rales (few rales at bases).  Abdominal: Soft. Bowel sounds are normal. He exhibits no distension. There is no tenderness. There is no rebound and no guarding.  Musculoskeletal: He exhibits no edema or tenderness.  Trace edema in lower legs  Neurological: He is alert. He has normal strength. No cranial nerve deficit (no facial droop, extraocular movements intact, no slurred speech) or sensory deficit. He exhibits normal muscle tone. He displays no seizure activity. Coordination normal.  Skin: Skin is warm and dry. No rash noted.  Psychiatric: He has a normal mood and affect.  Nursing note and vitals reviewed.    ED Treatments / Results  Labs (all labs ordered are listed, but only abnormal results are displayed) Labs Reviewed  BASIC METABOLIC PANEL - Abnormal; Notable for the following components:      Result Value   CO2 21 (*)    Glucose, Bld 118 (*)    All other components within normal limits  CBC WITH DIFFERENTIAL/PLATELET - Abnormal; Notable for the following components:   Platelets 148 (*)    All other components within normal limits  TROPONIN I  BRAIN NATRIURETIC PEPTIDE    EKG EKG Interpretation  Date/Time:  Tuesday July 06 2018 16:08:40 EST Ventricular Rate:  63 PR Interval:  144 QRS Duration: 94 QT Interval:  418 QTC Calculation: 427 R Axis:   8 Text Interpretation:  Normal sinus rhythm Normal ECG No significant change since last tracing Confirmed by [name] [phone]) on 07/06/2018 4:14:45 PM   Radiology Dg Chest 2 View  Result Date: 07/06/2018 CLINICAL DATA:  Dyspnea and hypertension EXAM: CHEST - 2 VIEW COMPARISON:  07/13/2017 FINDINGS: Mild central vascular congestion. Top-normal heart size with minimal aortic atherosclerosis at the arch. No aneurysm is identified. No acute osseous abnormality is seen. No effusion or pneumothorax. Mild degenerative changes are  present the dorsal spine. IMPRESSION: Mild central vascular congestion. Minimal aortic atherosclerosis. Electronically Signed   By: Ashley Royalty M.D.   On: 07/06/2018 17:31    Procedures Procedures (including critical care time)  Medications Ordered in ED Medications  furosemide (LASIX) injection 20 mg (20 mg Intravenous Given 07/06/18 1922)  potassium chloride SA (K-DUR,KLOR-CON) CR tablet 20 mEq (20 mEq Oral Given 07/06/18 1922)     Initial Impression / Assessment and Plan / ED Course  I have reviewed the triage vital signs and the nursing notes.  Pertinent labs & imaging results that were available during my care of the patient were reviewed by me and considered in my medical decision making (see chart for details).  Clinical Course as of Nov  [facility] Jul 06, 2018  1921 Discussed doing a delta troponin.  Patient states he is really anxious to go home.  He has to take care of his animals.   [JK]    Clinical Course User Index [JK] [name], MD    Patient presented to the emergency room with complaints of uncontrolled hypertension and some mild shortness of breath.  Patient's ED work-up is reassuring.  No signs of acute cardiac ischemia.  Chest x-ray showed some mild vascular congestion but his BNP is normal.   I doubt pulmonary embolism or pneumonia.  No signs to suggest acute cardiac ischemia.  I did discuss doing a delta troponin however the patient did not want to have that done.  Plan on increasing his lisinopril to 30 mg/day.  I also have him try taking a short course of a diuretic and have him follow-up with his primary care doctor this week.  Final Clinical Impressions(s) / ED Diagnoses   Final diagnoses:  Hypertension, unspecified type    ED Discharge Orders         Ordered    furosemide (LASIX) 20 MG tablet  Daily     07/06/18 1941    lisinopril (PRINIVIL,ZESTRIL) 30 MG tablet  Daily at bedtime     07/06/18 1941           [name], MD 07/06/18 1943

## 2018-07-06 NOTE — Discharge Instructions (Signed)
Increase your lisinopril to 30 mg per day.  Take the lasix medication as well.  It wall increase your urination.  Take it in the morning.  Follow up with your doctor this week to make sure you are improving.

## 2018-07-06 NOTE — ED Triage Notes (Signed)
Pt presents to ED for assessment of SOB and HTN (systolics in the 177N) from PCP.  Denies chest pain.  States symptoms x 3 days.  Lung sounds diminished at sort triage.

## 2018-07-06 NOTE — ED Notes (Signed)
Patient transported to X-ray 

## 2018-07-07 ENCOUNTER — Telehealth: Payer: Self-pay | Admitting: Nutrition

## 2018-07-07 NOTE — Telephone Encounter (Signed)
Pt. Had an appointment today to switch settings on his pump, becasuse he was to go on u-100 R insulin.  He reports that he has another 6 weeks of u-500 insulin remaining.  He was told to call me when he is down to his last week of insulin, and we can reschedule.

## 2018-08-19 ENCOUNTER — Telehealth: Payer: Self-pay | Admitting: Endocrinology

## 2018-08-19 NOTE — Telephone Encounter (Signed)
Patient is requesting a call back.  He was suppose to speak with Vaughan Basta about his insulin but stated he could not wait until she returns on Monday.

## 2018-08-19 NOTE — Telephone Encounter (Signed)
Returned pt call. States Vaughan Basta was planning to convert him from U500 to U100 via pump due to cost. Is almost out of U500 and would like a refill sent to [facility] in [place]. Further adds this will be a new Rx for him. States his CBG's consistently range 90 in the mornings and 275 by evening. Next OV 09/01/18 with Dr. Loanne Drilling. Please advise.

## 2018-08-19 NOTE — Telephone Encounter (Signed)
Ok to send 1 bottle U500 refill.  However, regarding the higher blood sugars in the evening, I will need to see the pump download to make adjustments.  For now, please make sure he is not missing insulin doses and start the boluses 30 minutes before meals.

## 2018-08-20 ENCOUNTER — Other Ambulatory Visit: Payer: Self-pay

## 2018-08-20 MED ORDER — INSULIN REGULAR HUMAN 100 UNIT/ML IJ SOLN: INTRAMUSCULAR | 11 refills | Status: DC

## 2018-08-20 NOTE — Telephone Encounter (Signed)
Is he bolusing at all? If so, I need the glucose target, insulin to carb ratios, and insulin sensitivity factors. Also, the active insulin time.,

## 2018-08-20 NOTE — Telephone Encounter (Signed)
Rx sent and patient informed of MD advice

## 2018-08-20 NOTE — Telephone Encounter (Signed)
Pt stated that his settings are as follows: 1.2 (midnight to 7:30am) 2.0 (7:30am-5:30pm) 1.2 (5:30pm- midnight)

## 2018-08-20 NOTE — Telephone Encounter (Signed)
Per Dr. Cruzita Lederer, she is willing to work with pt if she is able to review his pump settings. Called pt to make him aware. States he will call later with settings.

## 2018-08-20 NOTE — Telephone Encounter (Signed)
Please refill the U-100 reg x 1.  Please continue the same settings for now. I'll see you next time.

## 2018-08-20 NOTE — Telephone Encounter (Signed)
We cannot have him go without insulin. I see we do not have samples. I will need his pump settings now and will try to convert to U100 [facility] insulin.

## 2018-08-20 NOTE — Telephone Encounter (Signed)
Pt was called and informed of MD decision. Pt stated that he cannot afford the U500 insulin and pt stated that he would just go without insulin until Dr. Loanne Drilling gets back into the office.

## 2018-08-23 ENCOUNTER — Other Ambulatory Visit: Payer: Self-pay

## 2018-08-23 ENCOUNTER — Encounter: Payer: Medicare Other | Attending: Endocrinology | Admitting: Nutrition

## 2018-08-23 DIAGNOSIS — Z794 Long term (current) use of insulin: Secondary | ICD-10-CM | POA: Insufficient documentation

## 2018-08-23 DIAGNOSIS — E1165 Type 2 diabetes mellitus with hyperglycemia: Secondary | ICD-10-CM | POA: Insufficient documentation

## 2018-08-23 MED ORDER — INSULIN REGULAR HUMAN 100 UNIT/ML IJ SOLN: 200.0000 [IU] | Freq: Every day | INTRAMUSCULAR | 11 refills | Status: DC

## 2018-08-24 ENCOUNTER — Telehealth: Payer: Self-pay | Admitting: Nutrition

## 2018-08-24 NOTE — Telephone Encounter (Signed)
Patient reported that he was able to pick up his insulin at the drug store, and he started on this yesterday in the afternoon.  His blood sugar at HS was 195, and this AM it was 115.  He was told to call here if his readings stare dropping during the night, and he agreed to do this.

## 2018-08-24 NOTE — Patient Instructions (Signed)
Continue to count carbs at mealtime and wait 30 min. After bolusing before eating. Call if questions

## 2018-08-24 NOTE — Progress Notes (Signed)
Patient reports that he is on his last cartridge of U-500 insulin, and is needing to switch to R, due to cost of insulin.  He picked up Novolin R-1 vial from [facility], and it was $125.00.  He reports that he can not afford this insulin as wee, and does not know what to do.  The pharmacist recommended the Relion Novoloin R, and per Dr. Cordelia Pen order the prescription was changed, and the new pump settings were put into the pump.  He will go now to pick this up, and put in a new cartridge.  Pump was put into stop mode until he does this.   Per Dr. Cordelia Pen written orders, new pump settings for the [employer]ion R were put into the pump.  Basal rate:  MN: 6.0, 7:30AM: 10u/hr, 5:30PM: 6.0u/hr,   I/C: 9,  Target 100

## 2018-09-01 ENCOUNTER — Encounter: Payer: Self-pay | Admitting: Endocrinology

## 2018-09-01 ENCOUNTER — Telehealth: Payer: Self-pay | Admitting: Nutrition

## 2018-09-01 ENCOUNTER — Ambulatory Visit (INDEPENDENT_AMBULATORY_CARE_PROVIDER_SITE_OTHER): Payer: Medicare Other | Admitting: Endocrinology

## 2018-09-01 VITALS — BP 132/60 | HR 59 | Ht 72.0 in | Wt 280.0 lb

## 2018-09-01 DIAGNOSIS — Z794 Long term (current) use of insulin: Secondary | ICD-10-CM

## 2018-09-01 DIAGNOSIS — E1142 Type 2 diabetes mellitus with diabetic polyneuropathy: Secondary | ICD-10-CM

## 2018-09-01 LAB — POCT GLYCOSYLATED HEMOGLOBIN (HGB A1C): HEMOGLOBIN A1C: 7.6 % — AB (ref 4.0–5.6)

## 2018-09-01 MED ORDER — PARADIGM PUMP RESERVOIR 3ML MISC: 1.0000 | Freq: Every day | 3 refills | Status: DC

## 2018-09-01 MED ORDER — INSULIN REGULAR HUMAN 100 UNIT/ML IJ SOLN: 200.0000 [IU] | Freq: Every day | INTRAMUSCULAR | 11 refills | Status: DC

## 2018-09-01 MED ORDER — "QUICK-SET INFUSION 43"" 9MM MISC": 1.0000 | Freq: Every day | 3 refills | Status: DC

## 2018-09-01 NOTE — Telephone Encounter (Signed)
Mr. Pate was shown the 3 pumps.  He wants the one with the sensor that Medicare will cover.  He was shown the Tandem and Dexcom, and wants to get this one.  Paperwork filled out and faxed to both companies.   Pt. Appears to be stopping his pump for 12 hours because of lows 80-110, and to save insulin.  Also shutting the pump off by 13 hours.  Discussed the importance of not doing this.   He was shown how to decrease his basal rate per Dr. Cordelia Pen orders.  He had no final questions.

## 2018-09-01 NOTE — Patient Instructions (Addendum)
check your blood sugar 4 times a day.  vary the time of day when you check, between before the 3 meals, and at bedtime.  also check if you have symptoms of your blood sugar being too high or too low.  please keep a record of the readings and bring it to your next appointment here (or you can bring the meter itself).  You can write it on any piece of paper.  please call us sooner if your blood sugar goes below 70, or if you have a lot of readings over 200. basal rate of 10 units/hr, except for 5 units/hr, 17:30-07:30.  bolus of 1 unit/1.8 grams carbohydrate.   continue correction bolus (which some people call "sensitivity," or "insulin sensitivity ratio," or just "isr") of 1 unit for each 9 by which your glucose exceeds 100.  Please come back for a follow-up appointment in 3 months.   Please see the Vaughan Basta, to consider a new pump, which can be linked to the continuous glucose monitor.

## 2018-09-01 NOTE — Progress Notes (Signed)
Subjective:    Patient ID: Jesse Guerrero, male    DOB: Jan 30, 1951, 68 y.o.   MRN: [id number]  HPI Pt returns for f/u of diabetes mellitus: DM type: Insulin-requiring type 2.   Dx'ed: 4332 Complications: polyneuropathy and nephropathy Therapy: insulin since 2004 DKA: never Severe hypoglycemia: last episode of severe hypoglycemia in 2012 Pancreatitis: never Pancreatic imaging: normal on 2017 CT Other: he has a medtronic minimed pump (530G) Interval history: He takes these settings  TDD is approx 200 units.    basal rate of 10 units/hr, except for 6 units/hr, 17:30-07:30.  bolus of 1 unit/2 grams carbohydrate.   continue correction bolus (which some people call "sensitivity," or "insulin sensitivity ratio," or just "isr") of 1 unit for each 9 by which your glucose exceeds 100.  Pump timing is 12 hrs off. He suspends basal due to fasting hypoglycemia.  Past Medical History:  Diagnosis Date  . Hepatitis C   . High cholesterol   . Hypertension   . Sleep apnea     Past Surgical History:  Procedure Laterality Date  . BACK SURGERY    . CHOLECYSTECTOMY      Social History   Socioeconomic History  . Marital status: Married    Spouse name: Not on file  . Number of children: Not on file  . Years of education: Not on file  . Highest education level: Not on file  Occupational History  . Not on file  Social Needs  . Financial resource strain: Not on file  . Food insecurity:    Worry: Not on file    Inability: Not on file  . Transportation needs:    Medical: Not on file    Non-medical: Not on file  Tobacco Use  . Smoking status: Former Research scientist (life sciences)  . Smokeless tobacco: Never Used  Substance and Sexual Activity  . Alcohol use: Yes    Comment: Occasionally   . Drug use: No  . Sexual activity: Not on file  Lifestyle  . Physical activity:    Days per week: Not on file    Minutes per session: Not on file  . Stress: Not on file  Relationships  . Social connections:   Talks on phone: Not on file    Gets together: Not on file    Attends religious service: Not on file    Active member of club or organization: Not on file    Attends meetings of clubs or organizations: Not on file    Relationship status: Not on file  . Intimate partner violence:    Fear of current or ex partner: Not on file    Emotionally abused: Not on file    Physically abused: Not on file    Forced sexual activity: Not on file  Other Topics Concern  . Not on file  Social History Narrative  . Not on file    Current Outpatient Medications on File Prior to Visit  Medication Sig Dispense Refill  . aspirin EC 81 MG tablet Take 81 mg by mouth at bedtime.     Marland Kitchen atorvastatin (LIPITOR) 40 MG tablet Take 40 mg by mouth at bedtime.     Marland Kitchen esomeprazole (NEXIUM) 20 MG capsule Take 20 mg by mouth at bedtime.     . gabapentin (NEURONTIN) 300 MG capsule Take 600-900 mg by mouth See admin instructions. Take 600 mg by mouth in the morning and 900 mg at bedtime    . hydrALAZINE (APRESOLINE) 25 MG tablet Take 25 mg  by mouth 3 (three) times daily as needed (for a B/P >180/100).    Marland Kitchen lisinopril (PRINIVIL,ZESTRIL) 30 MG tablet Take 1 tablet (30 mg total) by mouth at bedtime. 30 tablet 0  . metFORMIN (GLUCOPHAGE) 1000 MG tablet Take 1,000 mg by mouth 2 (two) times daily with a meal.     . metoprolol tartrate (LOPRESSOR) 25 MG tablet Take 25 mg by mouth 2 (two) times daily.     No current facility-administered medications on file prior to visit.     No Known Allergies  Family History  Problem Relation Age of Onset  . Diabetes Father     BP 132/60 (BP Location: Left Arm, Patient Position: Sitting, Cuff Size: Large)   Pulse (!) 59   Ht 6' (1.829 m)   Wt 280 lb (127 kg)   SpO2 92%   BMI 37.97 kg/m   Review of Systems Denies LOC    Objective:   Physical Exam VITAL SIGNS:  See vs page GENERAL: no distress Pulses: dorsalis pedis intact bilat.   MSK: no deformity of the feet CV: no leg  edema Skin:  no ulcer on the feet, but the skin is dry.  normal color and temp on the feet.   Neuro: sensation is intact to touch on the feet.   A1c=7.6%     Assessment & Plan:  Insulin-requiring type 2 DM, with PN: he needs increased rx, if it can be done with a regimen that avoids or minimizes hypoglycemia.   Patient Instructions  check your blood sugar 4 times a day.  vary the time of day when you check, between before the 3 meals, and at bedtime.  also check if you have symptoms of your blood sugar being too high or too low.  please keep a record of the readings and bring it to your next appointment here (or you can bring the meter itself).  You can write it on any piece of paper.  please call us sooner if your blood sugar goes below 70, or if you have a lot of readings over 200. basal rate of 10 units/hr, except for 5 units/hr, 17:30-07:30.  bolus of 1 unit/1.8 grams carbohydrate.   continue correction bolus (which some people call "sensitivity," or "insulin sensitivity ratio," or just "isr") of 1 unit for each 9 by which your glucose exceeds 100.  Please come back for a follow-up appointment in 3 months.   Please see the Vaughan Basta, to consider a new pump, which can be linked to the continuous glucose monitor.

## 2018-09-24 ENCOUNTER — Encounter: Payer: Self-pay | Admitting: Internal Medicine

## 2018-10-22 ENCOUNTER — Telehealth: Payer: Self-pay

## 2018-10-22 NOTE — Telephone Encounter (Signed)
Please move up next appt to approx 1 week prior to colonoscopy

## 2018-10-22 NOTE — Telephone Encounter (Signed)
Dear Dr. Dr Loanne Drilling    Dr. Hilarie Fredrickson has scheduled the above patient for an endoscopy at [facility] on 11/11/18.  Our records show that he is on insulin therapy via an insulin pump.  Our colonoscopy prep protocol requires that:  the patient must be on a clear liquid diet the entire day prior to the procedure date as well as the morning of the procedure  the patient must be NPO for 3 hours prior to the procedure   the patient must consume a PEG 3350 solution to prepare for the procedure.  Please advise [place] of any adjustments that need to be made to the patient's insulin pump therapy prior to the above procedure date.    Please route your response to me.  If you have any questions, please call me at [phone].  Thank you for your help with this matter.  Sincerely,  Dixon Boos, [place]  Physician Recommendation:

## 2018-10-22 NOTE — Telephone Encounter (Signed)
Pt scheduled for next Tuesday at 745am

## 2018-10-22 NOTE — Telephone Encounter (Signed)
Jesse Guerrero, this pt is scheduled for his colon on 11/11/2018 and will be coming in for his PV on 10/28/2018. He does have an insulin pump. Could you please send a letter to his MD on how to manage his insulin.  Thanks for your time.  Gwyndolyn Saxon in [facility]

## 2018-10-28 ENCOUNTER — Other Ambulatory Visit: Payer: Self-pay

## 2018-10-28 ENCOUNTER — Ambulatory Visit (AMBULATORY_SURGERY_CENTER): Payer: Self-pay

## 2018-10-28 VITALS — Ht 72.0 in | Wt 271.2 lb

## 2018-10-28 DIAGNOSIS — Z1211 Encounter for screening for malignant neoplasm of colon: Secondary | ICD-10-CM

## 2018-10-28 NOTE — Progress Notes (Signed)
Denies allergies to eggs or soy products. Denies complication of anesthesia or sedation. Denies use of weight loss medication. Denies use of O2.   Emmi instructions declined.   In reviewing instructions for procedure. The patient states that he does not have a care partner to come and stay with him during his procedure. I gave the patient a flyer of a company that will provide a care partner and rides too and from the procedure. Patient saw the prices and said that he could not afford to use that service. Patient states that he wanted to cancel his appointment for his procedure and he states he will call back to reschedule once he is able to work something out.   Patients temperature was 82.2. Patient has not been around anyone that was sick and he was not having any symptoms.

## 2018-11-02 ENCOUNTER — Ambulatory Visit: Payer: 59 | Admitting: Endocrinology

## 2018-11-11 ENCOUNTER — Encounter: Payer: 59 | Admitting: Internal Medicine

## 2018-12-02 ENCOUNTER — Encounter: Payer: Self-pay | Admitting: Endocrinology

## 2018-12-02 ENCOUNTER — Ambulatory Visit: Payer: 59 | Admitting: Endocrinology

## 2018-12-03 ENCOUNTER — Ambulatory Visit (INDEPENDENT_AMBULATORY_CARE_PROVIDER_SITE_OTHER): Payer: Medicare Other | Admitting: Endocrinology

## 2018-12-03 ENCOUNTER — Telehealth: Payer: Self-pay

## 2018-12-03 ENCOUNTER — Other Ambulatory Visit: Payer: Self-pay

## 2018-12-03 DIAGNOSIS — Z794 Long term (current) use of insulin: Secondary | ICD-10-CM

## 2018-12-03 DIAGNOSIS — E1142 Type 2 diabetes mellitus with diabetic polyneuropathy: Secondary | ICD-10-CM

## 2018-12-03 NOTE — Patient Instructions (Addendum)
check your blood sugar 4 times a day.  vary the time of day when you check, between before the 3 meals, and at bedtime.  also check if you have symptoms of your blood sugar being too high or too low.  please keep a record of the readings and bring it to your next appointment here (or you can bring the meter itself).  You can write it on any piece of paper.  please call us sooner if your blood sugar goes below 70, or if you have a lot of readings over 200. basal rate of 10 units/hr, except for 5 units/hr, 17:30-07:30.  bolus of 1 unit/1.7 grams carbohydrate.   continue correction bolus (which some people call "sensitivity," or "insulin sensitivity ratio," or just "isr") of 1 unit for each 9 by which your glucose exceeds 100.  Please come back for a follow-up appointment in 2 months.

## 2018-12-03 NOTE — Progress Notes (Addendum)
Subjective:    Patient ID: Jesse Guerrero, male    DOB: 09-06-1950, 68 y.o.   MRN: [id number]  HPI  telehealth visit today via doxy video visit.  Alternatives to telehealth are presented to this patient, and the patient agrees to the telehealth visit. Pt is advised of the cost of the visit, and agrees to this, also.   Patient is at home, and I am at the office.   Pt returns for f/u of diabetes mellitus: DM type: Insulin-requiring type 2.   Dx'ed: 1308 Complications: polyneuropathy and nephropathy Therapy: insulin since 2004 DKA: never Severe hypoglycemia: last episode of severe hypoglycemia in 2012 Pancreatitis: never Pancreatic imaging: normal on 2017 CT Other: he has a medtronic minimed pump (530G); he take human insulin, due to cost Interval history: He takes these settings  basal rate of 10 units/hr, except for 5 units/hr, 17:30-07:30.  bolus of 1 unit/1.8 grams carbohydrate.   continue correction bolus (which some people call "sensitivity," or "insulin sensitivity ratio," or just "isr") of 1 unit for each 9 by which your glucose exceeds 100.   He says cbg is as low as 70 just twice per month.  Otherwise, most cbg's are in the 200's.  It is in general higher as the day goes on.   He is unable to access TDD info Past Medical History:  Diagnosis Date  . Diabetes mellitus without complication ([place])   . GERD (gastroesophageal reflux disease)   . Hepatitis C   . High cholesterol   . Hypertension   . Sleep apnea     Past Surgical History:  Procedure Laterality Date  . BACK SURGERY    . CHOLECYSTECTOMY      Social History   Socioeconomic History  . Marital status: Married    Spouse name: Not on file  . Number of children: Not on file  . Years of education: Not on file  . Highest education level: Not on file  Occupational History  . Not on file  Social Needs  . Financial resource strain: Not on file  . Food insecurity:    Worry: Not on file    Inability: Not on  file  . Transportation needs:    Medical: Not on file    Non-medical: Not on file  Tobacco Use  . Smoking status: Former Research scientist (life sciences)  . Smokeless tobacco: Never Used  . Tobacco comment: Quit 20 years ago  Substance and Sexual Activity  . Alcohol use: Yes    Comment: Occasionally   . Drug use: No  . Sexual activity: Not on file  Lifestyle  . Physical activity:    Days per week: Not on file    Minutes per session: Not on file  . Stress: Not on file  Relationships  . Social connections:    Talks on phone: Not on file    Gets together: Not on file    Attends religious service: Not on file    Active member of club or organization: Not on file    Attends meetings of clubs or organizations: Not on file    Relationship status: Not on file  . Intimate partner violence:    Fear of current or ex partner: Not on file    Emotionally abused: Not on file    Physically abused: Not on file    Forced sexual activity: Not on file  Other Topics Concern  . Not on file  Social History Narrative  . Not on file  Current Outpatient Medications on File Prior to Visit  Medication Sig Dispense Refill  . aspirin EC 81 MG tablet Take 81 mg by mouth at bedtime.     Marland Kitchen atorvastatin (LIPITOR) 40 MG tablet Take 40 mg by mouth at bedtime.     Marland Kitchen esomeprazole (NEXIUM) 20 MG capsule Take 20 mg by mouth at bedtime.     . gabapentin (NEURONTIN) 300 MG capsule Take 600-900 mg by mouth See admin instructions. Take 600 mg by mouth in the morning and 900 mg at bedtime    . hydrALAZINE (APRESOLINE) 25 MG tablet Take 25 mg by mouth 3 (three) times daily as needed (for a B/P >180/100).    . Insulin Infusion Pump Supplies (PARADIGM RESERVOIR 3ML) MISC 1 Device by Does not apply route daily. 90 each 3  . Insulin Infusion Pump Supplies (QUICK-SET INFUSION 43" 9MM) MISC 1 Device by Does not apply route daily. 90 each 3  . insulin regular (NOVOLIN R RELION) 100 units/mL injection Inject 2 mLs (200 Units total) into the skin  daily. 200 units daily for pump 70 mL 11  . lisinopril (PRINIVIL,ZESTRIL) 30 MG tablet Take 1 tablet (30 mg total) by mouth at bedtime. 30 tablet 0  . metFORMIN (GLUCOPHAGE) 1000 MG tablet Take 1,000 mg by mouth 2 (two) times daily with a meal.     . metoprolol tartrate (LOPRESSOR) 25 MG tablet Take 25 mg by mouth 2 (two) times daily.     No current facility-administered medications on file prior to visit.     No Known Allergies  Family History  Problem Relation Age of Onset  . Diabetes Father   . Colon cancer Neg Hx   . Esophageal cancer Neg Hx   . Stomach cancer Neg Hx   . Rectal cancer Neg Hx      Review of Systems He denies hypoglycemia.      Objective:   Physical Exam       Assessment & Plan:  Insulin-requiring type 2 DM, with PN.  Apparently overcontrolled Hypoglycemia, mild: This limits aggressiveness of glycemic control  Patient Instructions  check your blood sugar 4 times a day.  vary the time of day when you check, between before the 3 meals, and at bedtime.  also check if you have symptoms of your blood sugar being too high or too low.  please keep a record of the readings and bring it to your next appointment here (or you can bring the meter itself).  You can write it on any piece of paper.  please call us sooner if your blood sugar goes below 70, or if you have a lot of readings over 200. basal rate of 10 units/hr, except for 5 units/hr, 17:30-07:30.  bolus of 1 unit/1.7 grams carbohydrate.   continue correction bolus (which some people call "sensitivity," or "insulin sensitivity ratio," or just "isr") of 1 unit for each 9 by which your glucose exceeds 100.  Please come back for a follow-up appointment in 2 months.

## 2018-12-03 NOTE — Telephone Encounter (Signed)
Pt states he is need of transitioning over to Dexcom. Asked to speak to Leonia Reader to further discuss. Message sent

## 2018-12-06 ENCOUNTER — Ambulatory Visit: Payer: 59 | Admitting: Endocrinology

## 2018-12-06 NOTE — Telephone Encounter (Addendum)
Patient reports that his Medtronic pump is not out of warranty until  May.  He has notified [facility] about this, but has not heard anything from [facility].  I called Dexcom, and his old order was with another MD.  He will be faxing a new script for Dr. Loanne Drilling to sign, so he can  get his supplies sent.   Please make sure the new script says Dexcom G6.  Thank you

## 2018-12-06 NOTE — Telephone Encounter (Signed)
Seeking clarification:  We received documents from [facility] requesting Dr. Loanne Drilling sign the order and provide office notes. He completed and signed order and office notes have been sent. It is my understanding that this is still in process through [facility] and would not be sent to [facility]. Please advise.

## 2018-12-07 NOTE — Telephone Encounter (Signed)
I will await your response re: where to send Rx. Thank you

## 2018-12-07 NOTE — Telephone Encounter (Signed)
Dexcom told me yesterday that if his 2ndary insurance is not a pharmacy benefit, but just a Part A and B secondary, that [facility] will provide him with the Dexcom.  [facility] was going to research this with the info that I had together with the other papers.  He was going to see where to send the prescription, and let us know

## 2018-12-08 NOTE — Telephone Encounter (Signed)
Am waiting to hear back from [facility].  Will let you know.

## 2018-12-29 ENCOUNTER — Encounter: Payer: Self-pay | Admitting: Internal Medicine

## 2019-01-11 ENCOUNTER — Telehealth: Payer: Self-pay | Admitting: Nutrition

## 2019-01-11 NOTE — Telephone Encounter (Signed)
Message left on my machine to call him.  He got his Dexcome and needed help with this.  Message left on machine to call me back

## 2019-01-13 ENCOUNTER — Other Ambulatory Visit: Payer: Self-pay

## 2019-01-13 DIAGNOSIS — E1142 Type 2 diabetes mellitus with diabetic polyneuropathy: Secondary | ICD-10-CM

## 2019-01-13 MED ORDER — INSULIN REGULAR HUMAN 100 UNIT/ML IJ SOLN: 200.0000 [IU] | Freq: Every day | INTRAMUSCULAR | 11 refills | Status: DC

## 2019-01-18 ENCOUNTER — Telehealth: Payer: Self-pay | Admitting: Nutrition

## 2019-01-18 NOTE — Telephone Encounter (Signed)
Message left on my voice mail saying he is having trouble with his Dexcom.  When I called him, I Learned that he was having trouble with the sensor connecting after 2 hours.  I talked him through this process again, and told him that if it does not connect after 2 more hours, he will need to call Dexcom.  Michela Pitcher it may be his sensor, not inserted correctly, or his transmitter that is bad.  He agreed to do this.

## 2019-02-14 ENCOUNTER — Telehealth: Payer: Self-pay

## 2019-02-14 NOTE — Telephone Encounter (Signed)
Pt submitted an application for a placard and requested Dr. Loanne Drilling complete the Medical portion of the application. After further review, Dr. Loanne Drilling returned the application and asked that the pt take the application to his PCP for completion. Letter and application mailed to pt.

## 2019-02-15 ENCOUNTER — Ambulatory Visit: Payer: 59 | Admitting: Endocrinology

## 2019-02-22 ENCOUNTER — Other Ambulatory Visit: Payer: Self-pay | Admitting: Endocrinology

## 2019-02-22 DIAGNOSIS — E1142 Type 2 diabetes mellitus with diabetic polyneuropathy: Secondary | ICD-10-CM

## 2019-02-22 DIAGNOSIS — Z794 Long term (current) use of insulin: Secondary | ICD-10-CM

## 2019-02-23 ENCOUNTER — Other Ambulatory Visit (INDEPENDENT_AMBULATORY_CARE_PROVIDER_SITE_OTHER): Payer: Medicare Other

## 2019-02-23 ENCOUNTER — Other Ambulatory Visit: Payer: Self-pay

## 2019-02-23 DIAGNOSIS — Z794 Long term (current) use of insulin: Secondary | ICD-10-CM

## 2019-02-23 DIAGNOSIS — E1142 Type 2 diabetes mellitus with diabetic polyneuropathy: Secondary | ICD-10-CM | POA: Diagnosis not present

## 2019-02-23 LAB — BASIC METABOLIC PANEL
BUN: 13 mg/dL (ref 6–23)
CO2: 29 mEq/L (ref 19–32)
Calcium: 9.5 mg/dL (ref 8.4–10.5)
Chloride: 102 mEq/L (ref 96–112)
Creatinine, Ser: 0.87 mg/dL (ref 0.40–1.50)
GFR: 87.24 mL/min (ref >60.00)
Glucose, Bld: 160 mg/dL — ABNORMAL HIGH (ref 70–99)
Potassium: 4.4 mEq/L (ref 3.5–5.1)
Sodium: 139 mEq/L (ref 135–145)

## 2019-03-01 ENCOUNTER — Other Ambulatory Visit: Payer: Self-pay

## 2019-03-01 ENCOUNTER — Ambulatory Visit (INDEPENDENT_AMBULATORY_CARE_PROVIDER_SITE_OTHER): Payer: Medicare Other | Admitting: Endocrinology

## 2019-03-01 ENCOUNTER — Encounter: Payer: Self-pay | Admitting: Endocrinology

## 2019-03-01 VITALS — BP 154/70 | HR 69 | Ht 72.0 in | Wt 281.4 lb

## 2019-03-01 DIAGNOSIS — Z794 Long term (current) use of insulin: Secondary | ICD-10-CM

## 2019-03-01 DIAGNOSIS — E1142 Type 2 diabetes mellitus with diabetic polyneuropathy: Secondary | ICD-10-CM

## 2019-03-01 LAB — POCT GLYCOSYLATED HEMOGLOBIN (HGB A1C): Hemoglobin A1C: 8.2 % — AB (ref 4.0–5.6)

## 2019-03-01 MED ORDER — INSULIN LISPRO 100 UNIT/ML ~~LOC~~ SOLN: SUBCUTANEOUS | 11 refills | Status: DC

## 2019-03-01 NOTE — Progress Notes (Signed)
Subjective:    Patient ID: Jesse Guerrero, male    DOB: [birth year], 68 y.o.   MRN: [id number]  HPI Pt returns for f/u of diabetes mellitus: DM type: Insulin-requiring type 2.   Dx'ed: 1914 Complications: polyneuropathy and nephropathy Therapy: insulin since 2004 DKA: never Severe hypoglycemia: last episode of severe hypoglycemia in 2012.   Pancreatitis: never Pancreatic imaging: normal on 2017 CT Other: he has a medtronic minimed pump (530G); he stopped U-500 Reg, due to cost.   Interval history: He takes these settings:  basal rate of 10 units/hr, except for 5 units/hr, 17:30-07:30.  bolus of 1 unit/1.8 grams carbohydrate.   continue correction bolus (which some people call "sensitivity," or "insulin sensitivity ratio," or just "isr") of 1 unit for each 10 by which your glucose exceeds 100.   Pt says ins declined U-100 regular.  I reviewed continuous glucose monitor data.  Glucose varies from 100-260.  It is in general higher after meals TDD is approx 200 units/day Past Medical History:  Diagnosis Date  . Diabetes mellitus without complication ([place])   . GERD (gastroesophageal reflux disease)   . Hepatitis C   . High cholesterol   . Hypertension   . Sleep apnea     Past Surgical History:  Procedure Laterality Date  . BACK SURGERY    . CHOLECYSTECTOMY      Social History   Socioeconomic History  . Marital status: Married    Spouse name: Not on file  . Number of children: Not on file  . Years of education: Not on file  . Highest education level: Not on file  Occupational History  . Not on file  Social Needs  . Financial resource strain: Not on file  . Food insecurity    Worry: Not on file    Inability: Not on file  . Transportation needs    Medical: Not on file    Non-medical: Not on file  Tobacco Use  . Smoking status: Former Research scientist (life sciences)  . Smokeless tobacco: Never Used  . Tobacco comment: Quit 20 years ago  Substance and Sexual Activity  . Alcohol use: Yes    Comment: Occasionally   . Drug use: No  . Sexual activity: Not on file  Lifestyle  . Physical activity    Days per week: Not on file    Minutes per session: Not on file  . Stress: Not on file  Relationships  . Social Herbalist on phone: Not on file    Gets together: Not on file    Attends religious service: Not on file    Active member of club or organization: Not on file    Attends meetings of clubs or organizations: Not on file    Relationship status: Not on file  . Intimate partner violence    Fear of current or ex partner: Not on file    Emotionally abused: Not on file    Physically abused: Not on file    Forced sexual activity: Not on file  Other Topics Concern  . Not on file  Social History Narrative  . Not on file    Current Outpatient Medications on File Prior to Visit  Medication Sig Dispense Refill  . aspirin EC 81 MG tablet Take 81 mg by mouth at bedtime.     Marland Kitchen atorvastatin (LIPITOR) 40 MG tablet Take 40 mg by mouth at bedtime.     Marland Kitchen esomeprazole (NEXIUM) 20 MG capsule Take 20 mg by mouth  at bedtime.     . gabapentin (NEURONTIN) 300 MG capsule Take 600-900 mg by mouth See admin instructions. Take 600 mg by mouth in the morning and 900 mg at bedtime    . hydrALAZINE (APRESOLINE) 25 MG tablet Take 25 mg by mouth 3 (three) times daily as needed (for a B/P >180/100).    . Insulin Infusion Pump Supplies (PARADIGM RESERVOIR 3ML) MISC 1 Device by Does not apply route daily. 90 each 3  . Insulin Infusion Pump Supplies (QUICK-SET INFUSION 43" 9MM) MISC 1 Device by Does not apply route daily. 90 each 3  . lisinopril (PRINIVIL,ZESTRIL) 30 MG tablet Take 1 tablet (30 mg total) by mouth at bedtime. 30 tablet 0  . metFORMIN (GLUCOPHAGE) 1000 MG tablet Take 1,000 mg by mouth 2 (two) times daily with a meal.     . metoprolol tartrate (LOPRESSOR) 25 MG tablet Take 25 mg by mouth 2 (two) times daily.     No current facility-administered medications on file prior to visit.      No Known Allergies  Family History  Problem Relation Age of Onset  . Diabetes Father   . Colon cancer Neg Hx   . Esophageal cancer Neg Hx   . Stomach cancer Neg Hx   . Rectal cancer Neg Hx     BP (!) 154/70 (BP Location: Right Arm, Patient Position: Sitting, Cuff Size: Large)   Pulse 69   Ht 6' (1.829 m)   Wt 281 lb 6.4 oz (127.6 kg)   SpO2 95%   BMI 38.16 kg/m   Review of Systems Pt says bilat foot numbness is worse.  He has fallen several times.  He denies hypoglycemia.      Objective:   Physical Exam VITAL SIGNS:  See vs page GENERAL: no distress Pulses: dorsalis pedis intact bilat.   MSK: no deformity of the feet CV: no leg edema Skin:  no ulcer on the feet.  normal color and temp on the feet. Neuro: sensation is severely decreased to touch on the feet.   A1c=8.2%     Assessment & Plan:  HTN: is noted today Insulin-requiring type 2 DM, with DN: worse PN: worse  Patient Instructions  Your blood pressure is high today.  Please see your primary care provider soon, to have it rechecked check your blood sugar 4 times a day.  vary the time of day when you check, between before the 3 meals, and at bedtime.  also check if you have symptoms of your blood sugar being too high or too low.  please keep a record of the readings and bring it to your next appointment here (or you can bring the meter itself).  You can write it on any piece of paper.  please call us sooner if your blood sugar goes below 70, or if you have a lot of readings over 200. Please take these settings:  basal rate of 10 units/hr, except for 5 units/hr, 17:30-07:30.  bolus of 1 unit/1.4 grams carbohydrate.   continue correction bolus (which some people call "sensitivity," or "insulin sensitivity ratio," or just "isr") of 1 unit for each 9 by which your glucose exceeds 100.  Some specialists say that taking a high amount of folic acid (4-5 mg per day) helps the neuropathy.  I have sent a prescription  to your pharmacy, for Humalog, to see if insurance likes this.   Please come back for a follow-up appointment in 2 months.

## 2019-03-01 NOTE — Patient Instructions (Addendum)
Your blood pressure is high today.  Please see your primary care provider soon, to have it rechecked check your blood sugar 4 times a day.  vary the time of day when you check, between before the 3 meals, and at bedtime.  also check if you have symptoms of your blood sugar being too high or too low.  please keep a record of the readings and bring it to your next appointment here (or you can bring the meter itself).  You can write it on any piece of paper.  please call us sooner if your blood sugar goes below 70, or if you have a lot of readings over 200. Please take these settings:  basal rate of 10 units/hr, except for 5 units/hr, 17:30-07:30.  bolus of 1 unit/1.4 grams carbohydrate.   continue correction bolus (which some people call "sensitivity," or "insulin sensitivity ratio," or just "isr") of 1 unit for each 9 by which your glucose exceeds 100.  Some specialists say that taking a high amount of folic acid (4-5 mg per day) helps the neuropathy.  I have sent a prescription to your pharmacy, for Humalog, to see if insurance likes this.   Please come back for a follow-up appointment in 2 months.

## 2019-03-02 ENCOUNTER — Other Ambulatory Visit: Payer: Self-pay

## 2019-03-02 ENCOUNTER — Other Ambulatory Visit (INDEPENDENT_AMBULATORY_CARE_PROVIDER_SITE_OTHER): Payer: Medicare Other

## 2019-03-02 DIAGNOSIS — Z794 Long term (current) use of insulin: Secondary | ICD-10-CM

## 2019-03-02 DIAGNOSIS — E1142 Type 2 diabetes mellitus with diabetic polyneuropathy: Secondary | ICD-10-CM | POA: Diagnosis not present

## 2019-03-02 LAB — BASIC METABOLIC PANEL
BUN: 10 mg/dL (ref 6–23)
CO2: 28 mEq/L (ref 19–32)
Calcium: 9 mg/dL (ref 8.4–10.5)
Chloride: 106 mEq/L (ref 96–112)
Creatinine, Ser: 0.81 mg/dL (ref 0.40–1.50)
GFR: 94.73 mL/min (ref >60.00)
Glucose, Bld: 126 mg/dL — ABNORMAL HIGH (ref 70–99)
Potassium: 4.4 mEq/L (ref 3.5–5.1)
Sodium: 139 mEq/L (ref 135–145)

## 2019-03-04 LAB — C-PEPTIDE: C-Peptide: 1.19 ng/mL (ref 0.80–3.85)

## 2019-03-08 ENCOUNTER — Other Ambulatory Visit: Payer: Self-pay

## 2019-03-08 ENCOUNTER — Telehealth: Payer: Self-pay | Admitting: Nutrition

## 2019-03-08 DIAGNOSIS — E1142 Type 2 diabetes mellitus with diabetic polyneuropathy: Secondary | ICD-10-CM

## 2019-03-08 MED ORDER — INSULIN LISPRO 100 UNIT/ML ~~LOC~~ SOLN: SUBCUTANEOUS | 11 refills | Status: DC

## 2019-03-08 NOTE — Telephone Encounter (Signed)
This has been resolved and was done first thing this morning.

## 2019-03-08 NOTE — Telephone Encounter (Signed)
Jesse Guerrero says he went to [facility] to pick up his u-500 insulin and it was $750.00,  The pharmacist told him that if there was a diagnostic code on it, his [place] insurance would pay for it.  Please do this.

## 2019-03-22 ENCOUNTER — Telehealth: Payer: Self-pay | Admitting: Nutrition

## 2019-03-22 NOTE — Telephone Encounter (Signed)
Discussed C-peptide results with patient.  Results were sent to [facility].  He reports that blood sugars are still "very high".  Blood sugar at HS was 265.  He took more insulin that his pump said to take, and his FBS was still over 250 this AM.  He can not download his pump from home, but is able to come at 3:15 today for me to download this.

## 2019-03-22 NOTE — Telephone Encounter (Signed)
Patient was called back and new basal rates were put in and reverbalized correctly to me.  He reports that he is putting the blood sugar reading from his dexcom into there bolus wizard, and then goes out of the bolus wizard and does a manual bolus that includes a calculation for a meal amount a 1u for every 8 grams, in his head and adds it to the previous bolus.  He was encouraged to come into the office for bolus training of how to use the bolus wizard, but can not come today or Monday.  Appointment made for Tues at [place].

## 2019-03-22 NOTE — Telephone Encounter (Signed)
Patient came to parking lot and pump was downloaded.  He reports going back on Relion R because he can not afford the other insulin.  He also reports that he takes "more than the pump says to take, and his blood sugars are still not coming down. He says he waits 30 min. After bolusing before most meals and that he swims for 2 hours from 8-10 (this week) and 10-12( last week), and that he disconnects and takes some insulin before disconnecting for the missed basal rate--usually less than 1/2 the amount.  Says that exercise usually will not lower readings.   Very concerned over high readings.  Says neuropathy of feet much worse now that readings are high.   Please advise. Download put on Dr. Quin Hoop desk

## 2019-03-23 ENCOUNTER — Other Ambulatory Visit: Payer: Self-pay | Admitting: Internal Medicine

## 2019-03-23 DIAGNOSIS — E1142 Type 2 diabetes mellitus with diabetic polyneuropathy: Secondary | ICD-10-CM

## 2019-03-29 ENCOUNTER — Other Ambulatory Visit: Payer: Self-pay

## 2019-03-29 ENCOUNTER — Encounter: Payer: Medicare Other | Attending: Internal Medicine | Admitting: Nutrition

## 2019-03-29 DIAGNOSIS — Z794 Long term (current) use of insulin: Secondary | ICD-10-CM | POA: Diagnosis present

## 2019-03-29 DIAGNOSIS — E1165 Type 2 diabetes mellitus with hyperglycemia: Secondary | ICD-10-CM | POA: Insufficient documentation

## 2019-03-29 NOTE — Progress Notes (Signed)
Patient re demonstrated how he boluses.  Problems identified:  1.  He uses the bolus wizard.  He is wearing a Dexcom and usies those readings when he boluses.  He does not however put the reading into the bolus wizard when his blood sugar is less than 140.  His target is 100-130 from 9PM to 7AM.  Then 100-110 all other times.  I explained that he will not get 1-4 extra units if he does not put in all the readings.  He reported that he will do this. 2. He does not always put in his carbs, and he was shown this on the the download.  He put in no carbs Friday, Sat. Or Sunday of this past week.  He was told to do this, or he will short himself a substantial amount of insulin.  He agreed to do this as well. 3. He changes out his cartridge every day, but does not change out his infusion set until every 3 days.  His routine is this.  He disconnects his infusion set from his cartridge (not his body), fills his cartridge with 300 units, and attaches his infusion set to the cartridge and then primes his infusion set "a few seconds", so that he will get some extra insulin at HS every night.  I told him to not do this, and to discount from his body first, fill cartridge and attach infusion set to cartridge and then prime until 2 drops appear and do a correction dose.  I explained that we and he can not determine how much insulin he is getting, and that is important when we make basal rate changes.  Also he will put all readings into pump.  Dexcom and pump downloads were done.  Average blood sugar reading on Dexcom was 211, with no low readings. The reports were put in Dr. Quin Hoop desk, and it was decided that no changes will be made to the pump settings.  We will wait for 1 week to see the new download

## 2019-03-29 NOTE — Patient Instructions (Signed)
When bolusing, Always use the bolus wizard, and put in blood sugar reading, and carbs eaten to determine a better meal time insulin dose.  When changing the reservoir, disconnect the infusion set from you body, and to not reconnect to your body, until after it is primed Return in one week for pump download

## 2019-04-12 LAB — HM DIABETES EYE EXAM

## 2019-04-22 ENCOUNTER — Encounter: Payer: Self-pay | Admitting: Internal Medicine

## 2019-05-02 ENCOUNTER — Telehealth: Payer: Self-pay

## 2019-05-02 NOTE — Telephone Encounter (Signed)
Company: [employer]  Document: "Document request" for clinical documentation Other records requested: Office notes within past 90 days and C-Peptide with concurrent fasting glucose of 225 or below  All above requested information has been faxed successfully to [employer] listed above. Documents and fax confirmation have been placed in the faxed file for future reference.

## 2019-05-09 ENCOUNTER — Telehealth: Payer: Self-pay

## 2019-05-09 NOTE — Telephone Encounter (Signed)
Per Dr. Loanne Drilling, unable to complete CMN for CGM for [facility] without an appt. Routing this message to the front desk for scheduling purposes.

## 2019-05-10 ENCOUNTER — Other Ambulatory Visit: Payer: Self-pay

## 2019-05-12 ENCOUNTER — Ambulatory Visit (INDEPENDENT_AMBULATORY_CARE_PROVIDER_SITE_OTHER): Payer: Medicare Other | Admitting: Endocrinology

## 2019-05-12 ENCOUNTER — Encounter: Payer: Self-pay | Admitting: Endocrinology

## 2019-05-12 ENCOUNTER — Telehealth: Payer: Self-pay

## 2019-05-12 ENCOUNTER — Other Ambulatory Visit: Payer: Self-pay

## 2019-05-12 VITALS — BP 142/70 | HR 62 | Ht 72.0 in | Wt 286.2 lb

## 2019-05-12 DIAGNOSIS — Z23 Encounter for immunization: Secondary | ICD-10-CM

## 2019-05-12 DIAGNOSIS — Z794 Long term (current) use of insulin: Secondary | ICD-10-CM

## 2019-05-12 DIAGNOSIS — E1142 Type 2 diabetes mellitus with diabetic polyneuropathy: Secondary | ICD-10-CM

## 2019-05-12 LAB — POCT GLYCOSYLATED HEMOGLOBIN (HGB A1C): Hemoglobin A1C: 8.8 % — AB (ref 4.0–5.6)

## 2019-05-12 NOTE — Patient Instructions (Addendum)
Your blood pressure is high today.  Please see your primary care provider soon, to have it rechecked check your blood sugar 4 times a day.  vary the time of day when you check, between before the 3 meals, and at bedtime.  also check if you have symptoms of your blood sugar being too high or too low.  please keep a record of the readings and bring it to your next appointment here (or you can bring the meter itself).  You can write it on any piece of paper.  please call us sooner if your blood sugar goes below 70, or if you have a lot of readings over 200. Please take these settings:  basal rate of 10 units/hr, except for 5 units/hr, 20:00-07:00.  bolus of 1 unit/1.9 grams carbohydrate.   continue correction bolus (which some people call "sensitivity," or "insulin sensitivity ratio," or just "isr") of 1 unit for each 9 by which your glucose exceeds 100.  Please come back for a follow-up appointment in 1 month.

## 2019-05-12 NOTE — Progress Notes (Signed)
Subjective:    Patient ID: Jesse Guerrero, male    DOB: 25-Oct-1950, 68 y.o.   MRN: [id number]  HPI Pt returns for f/u of diabetes mellitus: DM type: Insulin-requiring type 2.   Dx'ed: 0000000 Complications: polyneuropathy and nephropathy Therapy: insulin since 2004 DKA: never Severe hypoglycemia: last episode was in 2012.   Pancreatitis: never Pancreatic imaging: normal on 2017 CT Other: he has a medtronic minimed pump (530G); he stopped U-500 Reg, due to cost.   Interval history: He takes these settings:  basal rate of 12 units/hr, 15:00-17:30, and 6 units/hr other times.   bolus of 1 unit/1.9 grams carbohydrate.   correction bolus (which some people call "sensitivity," or "insulin sensitivity ratio," or just "isr") of 1 unit for each 9 by which your glucose exceeds 100.  Meter is downloaded today, and the printout is scanned into the record.  Glucose varies from 170-400.  It is in general higher as the day goes on.  He takes 4 boluses per day.   TDD is approx 160-203 units/day (approx 60% of which is bolus).    He takes U-100 reg, as ins did not cover humalog.   Past Medical History:  Diagnosis Date  . Diabetes mellitus without complication ([place])   . GERD (gastroesophageal reflux disease)   . Hepatitis C   . High cholesterol   . Hypertension   . Sleep apnea     Past Surgical History:  Procedure Laterality Date  . BACK SURGERY    . CHOLECYSTECTOMY      Social History   Socioeconomic History  . Marital status: Widowed    Spouse name: Not on file  . Number of children: Not on file  . Years of education: Not on file  . Highest education level: Not on file  Occupational History  . Not on file  Social Needs  . Financial resource strain: Not on file  . Food insecurity    Worry: Not on file    Inability: Not on file  . Transportation needs    Medical: Not on file    Non-medical: Not on file  Tobacco Use  . Smoking status: Former Research scientist (life sciences)  . Smokeless tobacco: Never  Used  . Tobacco comment: Quit 20 years ago  Substance and Sexual Activity  . Alcohol use: Yes    Comment: Occasionally   . Drug use: No  . Sexual activity: Not on file  Lifestyle  . Physical activity    Days per week: Not on file    Minutes per session: Not on file  . Stress: Not on file  Relationships  . Social Herbalist on phone: Not on file    Gets together: Not on file    Attends religious service: Not on file    Active member of club or organization: Not on file    Attends meetings of clubs or organizations: Not on file    Relationship status: Not on file  . Intimate partner violence    Fear of current or ex partner: Not on file    Emotionally abused: Not on file    Physically abused: Not on file    Forced sexual activity: Not on file  Other Topics Concern  . Not on file  Social History Narrative  . Not on file    Current Outpatient Medications on File Prior to Visit  Medication Sig Dispense Refill  . aspirin EC 81 MG tablet Take 81 mg by mouth at bedtime.     Marland Kitchen  atorvastatin (LIPITOR) 40 MG tablet Take 40 mg by mouth at bedtime.     Marland Kitchen esomeprazole (NEXIUM) 20 MG capsule Take 20 mg by mouth at bedtime.     . gabapentin (NEURONTIN) 300 MG capsule Take 600-900 mg by mouth See admin instructions. Take 600 mg by mouth in the morning and 900 mg at bedtime    . hydrALAZINE (APRESOLINE) 25 MG tablet Take 25 mg by mouth 3 (three) times daily as needed (for a B/P >180/100).    . Insulin Infusion Pump Supplies (PARADIGM RESERVOIR 3ML) MISC 1 Device by Does not apply route daily. 90 each 3  . Insulin Infusion Pump Supplies (QUICK-SET INFUSION 43" 9MM) MISC 1 Device by Does not apply route daily. 90 each 3  . insulin lispro (HUMALOG) 100 UNIT/ML injection For use in pump, total of 200 units per day; E11.9 60 mL 11  . lisinopril (PRINIVIL,ZESTRIL) 30 MG tablet Take 1 tablet (30 mg total) by mouth at bedtime. 30 tablet 0  . metFORMIN (GLUCOPHAGE) 1000 MG tablet Take 1,000  mg by mouth 2 (two) times daily with a meal.     . metoprolol tartrate (LOPRESSOR) 25 MG tablet Take 25 mg by mouth 2 (two) times daily.     No current facility-administered medications on file prior to visit.     No Known Allergies  Family History  Problem Relation Age of Onset  . Diabetes Father   . Colon cancer Neg Hx   . Esophageal cancer Neg Hx   . Stomach cancer Neg Hx   . Rectal cancer Neg Hx     BP (!) 142/70 (BP Location: Left Arm, Patient Position: Sitting, Cuff Size: Large)   Pulse 62   Ht 6' (1.829 m)   Wt 286 lb 3.2 oz (129.8 kg)   SpO2 97%   BMI 38.82 kg/m   Review of Systems He denies hypoglycemia.     Objective:   Physical Exam VITAL SIGNS:  See vs page GENERAL: no distress Pulses: dorsalis pedis intact bilat.   MSK: no deformity of the feet CV: trace bilat leg edema.  Skin:  no ulcer on the feet, but there are heavy calluses.  normal color and temp on the feet. Neuro: sensation is intact to touch on the feet Ext: there is bilateral onychomycosis of the toenails.   Lab Results  Component Value Date   HGBA1C 8.8 (A) 05/12/2019       Assessment & Plan:  HTN: is noted today Insulin-requiring type 2 DM, with PN: worse Noncompliance with insulin dosing. Pt is assisted with pump changes today  Patient Instructions  Your blood pressure is high today.  Please see your primary care provider soon, to have it rechecked check your blood sugar 4 times a day.  vary the time of day when you check, between before the 3 meals, and at bedtime.  also check if you have symptoms of your blood sugar being too high or too low.  please keep a record of the readings and bring it to your next appointment here (or you can bring the meter itself).  You can write it on any piece of paper.  please call us sooner if your blood sugar goes below 70, or if you have a lot of readings over 200. Please take these settings:  basal rate of 10 units/hr, except for 5 units/hr,  20:00-07:00.  bolus of 1 unit/1.9 grams carbohydrate.   continue correction bolus (which some people call "sensitivity," or "insulin sensitivity  ratio," or just "isr") of 1 unit for each 9 by which your glucose exceeds 100.  Please come back for a follow-up appointment in 1 month.

## 2019-05-12 NOTE — Telephone Encounter (Signed)
Company: [employer]  Document: CMN Other records requested: N/A  All above requested information has been faxed successfully to [employer] listed above. Documents and fax confirmation have been placed in the faxed file for future reference.

## 2019-05-18 ENCOUNTER — Telehealth: Payer: Self-pay | Admitting: *Deleted

## 2019-05-18 NOTE — Telephone Encounter (Signed)
I have sent a note to Dr [name], patient's endocrinologist. We will await his recommendations.

## 2019-05-18 NOTE — Telephone Encounter (Signed)
please contact patient: We need to plan for your colonoscopy, so please move up your next appt to 1 week earlier.  Thank you.

## 2019-05-18 NOTE — Telephone Encounter (Signed)
Jesse Guerrero Jul 21, 1951 [id number]   Dear Dr. Loanne Drilling    Dr. Hilarie Fredrickson has scheduled the above patient for a colonoscopy at [facility] on 06/13/2019.  Our records show that he is on insulin therapy via an insulin pump.  Our colonoscopy prep protocol requires that:  the patient must be on a clear liquid diet the entire day prior to the procedure date as well as the morning of the procedure  the patient must be NPO for 3 to 4 hours prior to the procedure   the patient must consume a PEG 3350 solution to prepare for the procedure.  Please advise [place] of any adjustments that need to be made to the patient's insulin pump therapy prior to the above procedure date.    Please route your recommendations to Dixon Boos, [place].  If you have any questions, please call me at [phone].  Thank you for your help with this matter.  Sincerely,  Dixon Boos

## 2019-05-18 NOTE — Telephone Encounter (Signed)
Please call pt to move appt 1 week sooner per Dr. Cordelia Pen request.

## 2019-05-18 NOTE — Telephone Encounter (Addendum)
Dottie,  This patient is having a direct colonoscopy with Dr.Pyrtle on 06/13/2019, PV on 05/30/2019. Looks like he had this PV and cancelled colon. Patient has an insulin pump. Does not look like we got any insulin pump instructions. Please send to PCP. Thanks!

## 2019-05-19 NOTE — Telephone Encounter (Signed)
Completed. June 08, 2019 @ 9:45 a.m.

## 2019-05-23 ENCOUNTER — Telehealth: Payer: Self-pay | Admitting: Nutrition

## 2019-05-23 NOTE — Telephone Encounter (Signed)
Medicare says they will no longer pay for his pump supplies, because his C-peptide level was too high.  He has been on a pump for years,and is very concerned because his blood sugars are so volatile on injections.  I told him that if he sends [place] a copy of the letter, maybe Dr. Loanne Drilling can write a letter to try dispute this denial. He agreed to drop of a copy for [place]

## 2019-05-30 ENCOUNTER — Other Ambulatory Visit (HOSPITAL_COMMUNITY): Payer: Self-pay | Admitting: General Surgery

## 2019-05-30 ENCOUNTER — Other Ambulatory Visit: Payer: Self-pay | Admitting: General Surgery

## 2019-05-30 ENCOUNTER — Ambulatory Visit (AMBULATORY_SURGERY_CENTER): Payer: Self-pay | Admitting: *Deleted

## 2019-05-30 ENCOUNTER — Other Ambulatory Visit: Payer: Self-pay

## 2019-05-30 VITALS — Temp 96.6°F | Ht 72.0 in | Wt 289.0 lb

## 2019-05-30 DIAGNOSIS — Z1211 Encounter for screening for malignant neoplasm of colon: Secondary | ICD-10-CM

## 2019-05-30 MED ORDER — PEG 3350-KCL-NA BICARB-NACL 420 G PO SOLR: 4000.0000 mL | Freq: Once | ORAL | 0 refills | Status: AC

## 2019-05-30 NOTE — Progress Notes (Signed)
No egg or soy allergy known to patient  No issues with past sedation with any surgeries  or procedures, no intubation problems  No diet pills per patient No home 02 use per patient  No blood thinners per patient  Pt denies issues with constipation  No A fib or A flutter  EMMI video sent to pt's e mail  Pt has an Appt on 10/21 with Dr Loanne Drilling to discuss his insulin pump   Due to the COVID-19 pandemic we are asking patients to follow these guidelines. Please only bring one care partner. Please be aware that your care partner may wait in the car in the parking lot or if they feel like they will be too hot to wait in the car, they may wait in the lobby on the 4th floor. All care partners are required to wear a mask the entire time (we do not have any that we can provide them), they need to practice social distancing, and we will do a Covid check for all patient's and care partners when you arrive. Also we will check their temperature and your temperature. If the care partner waits in their car they need to stay in the parking lot the entire time and we will call them on their cell phone when the patient is ready for discharge so they can bring the car to the front of the building. Also all patient's will need to wear a mask into building.  Pt declined the COVID screen for now

## 2019-06-01 ENCOUNTER — Telehealth: Payer: Self-pay | Admitting: *Deleted

## 2019-06-01 NOTE — Telephone Encounter (Signed)
   Primary Cardiologist: New to [facility]  Chart reviewed as part of pre-operative protocol coverage. Because of Jesse Guerrero's past medical history and time since last visit, he/she will require a follow-up visit in order to better assess preoperative cardiovascular risk.  Pre-op covering staff: - Please schedule New Consultation appointment and call patient to inform them. - Please contact requesting surgeon's office via preferred method (i.e, phone, fax) to inform them of need for appointment prior to surgery.  If applicable, this message will also be routed to pharmacy pool and/or primary cardiologist for input on holding anticoagulant/antiplatelet agent as requested below so that this information is available at time of patient's appointment.   [place], [place]  06/01/2019, 4:45 PM

## 2019-06-01 NOTE — Telephone Encounter (Signed)
   [facility] Pre-operative Risk Assessment    Request for surgical clearance: [facility] IS AWARE PT WILL NEED A NEW PT APPT. LOOKS LIKE WE HAVE NEVER SEEN THIS PT.    1. What type of surgery is being performed? GASTRIC BYPASS   2. When is this surgery scheduled? April 2021   3. What type of clearance is required (medical clearance vs. Pharmacy clearance to hold med vs. Both)? MEDICAL  4. Are there any medications that need to be held prior to surgery and how long? ASA    5. Practice name and name of physician performing surgery? [facility]; DR. Gurney Maxin   6. What is your office phone number [phone]    7.   What is your office fax number [phone]  8.   Anesthesia type (None, local, MAC, general) ? GENERAL   Julaine Hua 06/01/2019, 4:15 PM  _________________________________________________________________   (provider comments below)

## 2019-06-02 NOTE — Telephone Encounter (Signed)
Patient is scheduled for an appointment with Dr. Gwenlyn Found on 06/03/19

## 2019-06-02 NOTE — Telephone Encounter (Signed)
Will route to scheduling for New Consultation appointment.

## 2019-06-03 ENCOUNTER — Other Ambulatory Visit: Payer: Self-pay

## 2019-06-03 ENCOUNTER — Telehealth: Payer: Self-pay | Admitting: Internal Medicine

## 2019-06-03 ENCOUNTER — Ambulatory Visit (INDEPENDENT_AMBULATORY_CARE_PROVIDER_SITE_OTHER): Payer: Medicare Other | Admitting: Cardiovascular Disease

## 2019-06-03 ENCOUNTER — Encounter: Payer: Self-pay | Admitting: Cardiovascular Disease

## 2019-06-03 VITALS — BP 136/68 | HR 58 | Temp 97.1°F | Ht 71.0 in | Wt 292.0 lb

## 2019-06-03 DIAGNOSIS — Z8249 Family history of ischemic heart disease and other diseases of the circulatory system: Secondary | ICD-10-CM | POA: Insufficient documentation

## 2019-06-03 DIAGNOSIS — Z1211 Encounter for screening for malignant neoplasm of colon: Secondary | ICD-10-CM

## 2019-06-03 DIAGNOSIS — E785 Hyperlipidemia, unspecified: Secondary | ICD-10-CM | POA: Insufficient documentation

## 2019-06-03 DIAGNOSIS — E669 Obesity, unspecified: Secondary | ICD-10-CM | POA: Insufficient documentation

## 2019-06-03 DIAGNOSIS — E782 Mixed hyperlipidemia: Secondary | ICD-10-CM | POA: Diagnosis not present

## 2019-06-03 DIAGNOSIS — Z794 Long term (current) use of insulin: Secondary | ICD-10-CM

## 2019-06-03 DIAGNOSIS — E119 Type 2 diabetes mellitus without complications: Secondary | ICD-10-CM

## 2019-06-03 DIAGNOSIS — Z87891 Personal history of nicotine dependence: Secondary | ICD-10-CM

## 2019-06-03 DIAGNOSIS — I10 Essential (primary) hypertension: Secondary | ICD-10-CM | POA: Diagnosis not present

## 2019-06-03 DIAGNOSIS — Z136 Encounter for screening for cardiovascular disorders: Secondary | ICD-10-CM

## 2019-06-03 DIAGNOSIS — Z01818 Encounter for other preprocedural examination: Secondary | ICD-10-CM | POA: Diagnosis not present

## 2019-06-03 MED ORDER — PEG 3350-KCL-NA BICARB-NACL 420 G PO SOLR: 4000.0000 mL | Freq: Once | ORAL | 0 refills | Status: AC

## 2019-06-03 NOTE — Telephone Encounter (Signed)
Pt states that his local [facility] does not have prep in stock, he is asking if we could send prescription to a different [facility] either on [address], He is asking if we could ask the pharmacy if they have it in stock before we send prescription and call him to let him know which pharmacy it was sent to.

## 2019-06-03 NOTE — Progress Notes (Signed)
06/03/2019 Jesse Guerrero   10-Jun-1951  [id number]  Primary Physician Dwain Sarna, MD Primary Cardiologist: Lorretta Harp MD Jesse Guerrero, [place]  HPI:  Jesse Guerrero is a 68 y.o. severely overweight widowed Caucasian male (wife of 63 years died last year a week before the 27th anniversary), father of 42, grandfather of 26 grandchildren referred for preoperative clearance before gastric bypass surgery.  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  His risk factors include remote tobacco abuse having smoked 30 to 50 pack years and quit 20 years ago.  He has treated hypertension, diabetes and hyperlipidemia as well as family history of heart disease with a father who had a stent.  He is fairly active and swims a mile a day 6 days a week without limitation or symptoms.   Current Meds  Medication Sig  . aspirin EC 81 MG tablet Take 81 mg by mouth at bedtime.   Marland Kitchen atorvastatin (LIPITOR) 40 MG tablet Take 40 mg by mouth at bedtime.   Marland Kitchen esomeprazole (NEXIUM) 20 MG capsule Take 20 mg by mouth at bedtime.   . gabapentin (NEURONTIN) 300 MG capsule Take 600-900 mg by mouth See admin instructions. Take 600 mg by mouth in the morning and 900 mg at bedtime  . hydrALAZINE (APRESOLINE) 25 MG tablet Take 25 mg by mouth 3 (three) times daily as needed (for a B/P >180/100).  . Insulin Infusion Pump Supplies (PARADIGM RESERVOIR 3ML) MISC 1 Device by Does not apply route daily.  . Insulin Infusion Pump Supplies (QUICK-SET INFUSION 43" 9MM) MISC 1 Device by Does not apply route daily.  . insulin lispro (HUMALOG) 100 UNIT/ML injection For use in pump, total of 200 units per day; E11.9  . lisinopril (PRINIVIL,ZESTRIL) 30 MG tablet Take 1 tablet (30 mg total) by mouth at bedtime.  . metFORMIN (GLUCOPHAGE) 1000 MG tablet Take 1,000 mg by mouth 2 (two) times daily with a meal.   . metoprolol tartrate (LOPRESSOR) 25 MG  tablet Take 25 mg by mouth 2 (two) times daily.  Marland Kitchen NOVOLIN R RELION 100 UNIT/ML injection INJECT 200 UNITS TOTAL (2MLS) SUBQ DAILY FOR PUMP     No Known Allergies  Social History   Socioeconomic History  . Marital status: Widowed    Spouse name: Not on file  . Number of children: Not on file  . Years of education: Not on file  . Highest education level: Not on file  Occupational History  . Not on file  Social Needs  . Financial resource strain: Not on file  . Food insecurity    Worry: Not on file    Inability: Not on file  . Transportation needs    Medical: Not on file    Non-medical: Not on file  Tobacco Use  . Smoking status: Former Research scientist (life sciences)  . Smokeless tobacco: Never Used  . Tobacco comment: Quit 20 years ago  Substance and Sexual Activity  . Alcohol use: Yes    Comment: Occasionally   . Drug use: No  . Sexual activity: Not on file  Lifestyle  . Physical activity    Days per week: Not on file    Minutes per session: Not on file  . Stress: Not on file  Relationships  . Social Herbalist on phone: Not on file    Gets together: Not on file    Attends religious  service: Not on file    Active member of club or organization: Not on file    Attends meetings of clubs or organizations: Not on file    Relationship status: Not on file  . Intimate partner violence    Fear of current or ex partner: Not on file    Emotionally abused: Not on file    Physically abused: Not on file    Forced sexual activity: Not on file  Other Topics Concern  . Not on file  Social History Narrative  . Not on file     Review of Systems: General: negative for chills, fever, night sweats or weight changes.  Cardiovascular: negative for chest pain, dyspnea on exertion, edema, orthopnea, palpitations, paroxysmal nocturnal dyspnea or shortness of breath Dermatological: negative for rash Respiratory: negative for cough or wheezing Urologic: negative for hematuria Abdominal: negative  for nausea, vomiting, diarrhea, bright red blood per rectum, melena, or hematemesis Neurologic: negative for visual changes, syncope, or dizziness All other systems reviewed and are otherwise negative except as noted above.    Blood pressure 136/68, pulse (!) 58, temperature (!) 97.1 F (36.2 C), height 5\' 11"  (1.803 m), weight 292 lb (132.5 kg).  General appearance: alert and no distress Neck: no adenopathy, no carotid bruit, no JVD, supple, symmetrical, trachea midline and thyroid not enlarged, symmetric, no tenderness/mass/nodules Lungs: clear to auscultation bilaterally Heart: regular rate and rhythm, S1, S2 normal, no murmur, click, rub or gallop Extremities: extremities normal, atraumatic, no cyanosis or edema Pulses: 2+ and symmetric Skin: Skin color, texture, turgor normal. No rashes or lesions Neurologic: Alert and oriented X 3, normal strength and tone. Normal symmetric reflexes. Normal coordination and gait  EKG sinus bradycardia 58 without ST or T wave changes.  Personally reviewed this EKG.  ASSESSMENT AND PLAN:   Preoperative clearance Mr.Delehantty comes today for preoperative clearance before gastric bypass surgery.  He does have multiple cardiac risk factors including family history with a father who had a stent, remote tobacco abuse having quit 20 years ago but smoked 30 to 50 pack years, treated hypertension, diabetes and hyperlipidemia.  He is very active, swims a mile a day 6 days a week without symptoms.  I am going to get a pharmacologic Myoview stress test to risk stratify him.  Hyperlipidemia History of hyperlipidemia on statin therapy followed by his PCP  Essential hypertension History of essential hypertension with blood pressure measured today at 136/68.  He is on hydralazine lisinopril and metoprolol.      Lorretta Harp MD FACP,FACC,FAHA, [facility] 06/03/2019 9:55 AM

## 2019-06-03 NOTE — Telephone Encounter (Signed)
Patient request prep sent to [facility]. Sent at this time. Pt is aware.

## 2019-06-03 NOTE — Patient Instructions (Signed)
Medication Instructions:  Your physician recommends that you continue on your current medications as directed. Please refer to the Current Medication list given to you today.  If you need a refill on your cardiac medications before your next appointment, please call your pharmacy.   Lab work: none If you have labs (blood work) drawn today and your tests are completely normal, you will receive your results only by: Marland Kitchen MyChart Message (if you have MyChart) OR . A paper copy in the mail If you have any lab test that is abnormal or we need to change your treatment, we will call you to review the results.  Testing/Procedures: Your physician has requested that you have a lexiscan myoview. For further information please visit [email or web address]. Please follow instruction sheet, as given.    Cardiac Nuclear Scan A cardiac nuclear scan is a test that is done to check the flow of blood to your heart. It is done when you are resting and when you are exercising. The test looks for problems such as:  Not enough blood reaching a portion of the heart.  The heart muscle not working as it should. You may need this test if:  You have heart disease.  You have had lab results that are not normal.  You have had heart surgery or a balloon procedure to open up blocked arteries (angioplasty).  You have chest pain.  You have shortness of breath. In this test, a special dye (tracer) is put into your bloodstream. The tracer will travel to your heart. A camera will then take pictures of your heart to see how the tracer moves through your heart. This test is usually done at a hospital and takes 2-4 hours. Tell a doctor about:  Any allergies you have.  All medicines you are taking, including vitamins, herbs, eye drops, creams, and over-the-counter medicines.  Any problems you or family members have had with anesthetic medicines.  Any blood disorders you have.  Any surgeries you have had.  Any medical  conditions you have.  Whether you are pregnant or may be pregnant. What are the risks? Generally, this is a safe test. However, problems may occur, such as:  Serious chest pain and heart attack. This is only a risk if the stress portion of the test is done.  Rapid heartbeat.  A feeling of warmth in your chest. This feeling usually does not last long.  Allergic reaction to the tracer. What happens before the test?  Ask your doctor about changing or stopping your normal medicines. This is important.  Follow instructions from your doctor about what you cannot eat or drink.  Remove your jewelry on the day of the test. What happens during the test?  An IV tube will be inserted into one of your veins.  Your doctor will give you a small amount of tracer through the IV tube.  You will wait for 20-40 minutes while the tracer moves through your bloodstream.  Your heart will be monitored with an electrocardiogram (ECG).  You will lie down on an exam table.  Pictures of your heart will be taken for about 15-20 minutes.  You may also have a stress test. For this test, one of these things may be done: ? You will be asked to exercise on a treadmill or a stationary bike. ? You will be given medicines that will make your heart work harder. This is done if you are unable to exercise.  When blood flow to your heart has peaked,  a tracer will again be given through the IV tube.  After 20-40 minutes, you will get back on the exam table. More pictures will be taken of your heart.  Depending on the tracer that is used, more pictures may need to be taken 3-4 hours later.  Your IV tube will be removed when the test is over. The test may vary among doctors and hospitals. What happens after the test?  Ask your doctor: ? Whether you can return to your normal schedule, including diet, activities, and medicines. ? Whether you should drink more fluids. This will help to remove the tracer from your  body. Drink enough fluid to keep your pee (urine) pale yellow.  Ask your doctor, or the department that is doing the test: ? When will my results be ready? ? How will I get my results? Summary  A cardiac nuclear scan is a test that is done to check the flow of blood to your heart.  Tell your doctor whether you are pregnant or may be pregnant.  Before the test, ask your doctor about changing or stopping your normal medicines. This is important.  Ask your doctor whether you can return to your normal activities. You may be asked to drink more fluids. This information is not intended to replace advice given to you by your health care provider. Make sure you discuss any questions you have with your health care provider. Document Released: 01/18/2018 Document Revised: 11/24/2018 Document Reviewed: 01/18/2018 Elsevier Patient Education  [place]: At [facility], you and your health needs are our priority.  As part of our continuing mission to provide you with exceptional heart care, we have created designated Provider Care Teams.  These Care Teams include your primary Cardiologist (physician) and Advanced Practice Providers (APPs -  Physician Assistants and Nurse Practitioners) who all work together to provide you with the care you need, when you need it. . You will need a follow up appointment as needed. You may see Dr. Gwenlyn Found or one of the following Advanced Practice Providers on your designated Care Team:   . Kerin Ransom, PA-C . Daleen Snook Kroeger, PA-C . Sande Rives, PA-C ____________________ . Almyra Deforest, PA-C . Fabian Sharp, PA-C . Jory Sims, DNP . Rosaria Ferries, PA-C

## 2019-06-03 NOTE — Assessment & Plan Note (Signed)
Jesse Guerrero comes today for preoperative clearance before gastric bypass surgery.  He does have multiple cardiac risk factors including family history with a father who had a stent, remote tobacco abuse having quit 20 years ago but smoked 30 to 50 pack years, treated hypertension, diabetes and hyperlipidemia.  He is very active, swims a mile a day 6 days a week without symptoms.  I am going to get a pharmacologic Myoview stress test to risk stratify him.

## 2019-06-03 NOTE — Telephone Encounter (Signed)
Pt called back and said that all [facility] have this prep on back order. Asking if he can get a different kind of prep then the one prescribed to see if he is able to get it.

## 2019-06-03 NOTE — Assessment & Plan Note (Signed)
History of essential hypertension with blood pressure measured today at 136/68.  He is on hydralazine lisinopril and metoprolol.

## 2019-06-03 NOTE — Assessment & Plan Note (Signed)
History of hyperlipidemia on statin therapy followed by his PCP 

## 2019-06-07 ENCOUNTER — Ambulatory Visit (HOSPITAL_COMMUNITY)
Admission: RE | Admit: 2019-06-07 | Discharge: 2019-06-07 | Disposition: A | Discharge status: Home / Self Care | Payer: Medicare Other | Source: Ambulatory Visit | Attending: General Surgery | Admitting: General Surgery

## 2019-06-07 ENCOUNTER — Other Ambulatory Visit: Payer: Self-pay

## 2019-06-08 ENCOUNTER — Telehealth: Payer: Self-pay | Admitting: *Deleted

## 2019-06-08 ENCOUNTER — Other Ambulatory Visit: Payer: Self-pay

## 2019-06-08 ENCOUNTER — Ambulatory Visit (INDEPENDENT_AMBULATORY_CARE_PROVIDER_SITE_OTHER): Payer: Medicare Other | Admitting: Endocrinology

## 2019-06-08 ENCOUNTER — Encounter: Payer: Self-pay | Admitting: Endocrinology

## 2019-06-08 VITALS — BP 130/60 | HR 62 | Ht 71.0 in | Wt 288.0 lb

## 2019-06-08 DIAGNOSIS — E1142 Type 2 diabetes mellitus with diabetic polyneuropathy: Secondary | ICD-10-CM | POA: Diagnosis not present

## 2019-06-08 DIAGNOSIS — Z794 Long term (current) use of insulin: Secondary | ICD-10-CM

## 2019-06-08 MED ORDER — DEXCOM G6 SENSOR MISC: 1.0000 | 2 refills | Status: DC

## 2019-06-08 NOTE — Patient Instructions (Addendum)
check your blood sugar 4 times a day.  vary the time of day when you check, between before the 3 meals, and at bedtime.  also check if you have symptoms of your blood sugar being too high or too low.  please keep a record of the readings and bring it to your next appointment here (or you can bring the meter itself).  You can write it on any piece of paper.  please call us sooner if your blood sugar goes below 70, or if you have a lot of readings over 200. Please take these settings:  basal rate of 10 units/hr, except for 5 units/hr, 20:00-07:00.  bolus of 1 unit/1.7 grams carbohydrate.   continue correction bolus (which some people call "sensitivity," or "insulin sensitivity ratio," or just "isr") of 1 unit for each 9 by which your glucose exceeds 100.  Suspend your pump for exercise. Because of the low blood sugar, you should resume your continuous glucose monitor.  Some specialists say that taking a high amount of folic acid (4-5 mg per day) helps the neuropathy.   The day before the colonoscopy, reduce all your settings in half.   The day of the colonoscopy, continue the half amount of basal.  Then resume the usual amounts when you are eating again.   Please come back for a follow-up appointment in 1 month.

## 2019-06-08 NOTE — Progress Notes (Signed)
Subjective:    Patient ID: Jesse Guerrero, male    DOB: 09/08/1950, 68 y.o.   MRN: [id number]  HPI Pt returns for f/u of diabetes mellitus: DM type: Insulin-requiring type 2.   Dx'ed: 0000000 Complications: polyneuropathy and nephropathy Therapy: insulin since 2004 DKA: never Severe hypoglycemia: last episode was in 2012.   Pancreatitis: never Pancreatic imaging: normal on 2017 CT Other: he has a medtronic minimed pump (530G); he takes human insulin U-100, due to cost.   Interval history: He takes these settings:  basal rate of 10 units/hr, 07:00-20:00, and 5 units/hr other times.   bolus of 1 unit/1.9 grams carbohydrate.   correction bolus (which some people call "sensitivity," or "insulin sensitivity ratio," or just "isr") of 1 unit for each 9 by which your glucose exceeds 100.  Meter is downloaded today, and the printout is scanned into the record.  Glucose varies from 201-366. There is no trend throughout the day.  He takes 4 boluses per day.   TDD is approx 169-250 units/day (approx 60% of which is bolus).  He had mild hypoglycemia last week, at 4 PM, with activity (he had missed lunch, and lunch bolus).   He has moderate numbness of the feet, and assoc pain. Past Medical History:  Diagnosis Date  . Asthma    as child  . Diabetes mellitus without complication ([place])   . GERD (gastroesophageal reflux disease)   . Hepatitis C   . High cholesterol   . Hypertension   . Sleep apnea    wears cpap     Past Surgical History:  Procedure Laterality Date  . BACK SURGERY    . CHOLECYSTECTOMY    . COLONOSCOPY      x 3 per pt - all normal - last colon 10 yrs ago either in [place] or [place]  . thumb surgery Left     Social History   Socioeconomic History  . Marital status: Widowed    Spouse name: Not on file  . Number of children: Not on file  . Years of education: Not on file  . Highest education level: Not on file  Occupational History  . Not on file  Social Needs   . Financial resource strain: Not on file  . Food insecurity    Worry: Not on file    Inability: Not on file  . Transportation needs    Medical: Not on file    Non-medical: Not on file  Tobacco Use  . Smoking status: Former Research scientist (life sciences)  . Smokeless tobacco: Never Used  . Tobacco comment: Quit 20 years ago  Substance and Sexual Activity  . Alcohol use: Yes    Comment: Occasionally   . Drug use: No  . Sexual activity: Not on file  Lifestyle  . Physical activity    Days per week: Not on file    Minutes per session: Not on file  . Stress: Not on file  Relationships  . Social Herbalist on phone: Not on file    Gets together: Not on file    Attends religious service: Not on file    Active member of club or organization: Not on file    Attends meetings of clubs or organizations: Not on file    Relationship status: Not on file  . Intimate partner violence    Fear of current or ex partner: Not on file    Emotionally abused: Not on file    Physically abused: Not on  file    Forced sexual activity: Not on file  Other Topics Concern  . Not on file  Social History Narrative  . Not on file    Current Outpatient Medications on File Prior to Visit  Medication Sig Dispense Refill  . aspirin EC 81 MG tablet Take 81 mg by mouth at bedtime.     Marland Kitchen atorvastatin (LIPITOR) 40 MG tablet Take 40 mg by mouth at bedtime.     Marland Kitchen esomeprazole (NEXIUM) 20 MG capsule Take 20 mg by mouth at bedtime.     . gabapentin (NEURONTIN) 300 MG capsule Take 600-900 mg by mouth See admin instructions. Take 600 mg by mouth in the morning and 900 mg at bedtime    . hydrALAZINE (APRESOLINE) 25 MG tablet Take 25 mg by mouth 3 (three) times daily as needed (for a B/P >180/100).    . Insulin Infusion Pump Supplies (PARADIGM RESERVOIR 3ML) MISC 1 Device by Does not apply route daily. 90 each 3  . Insulin Infusion Pump Supplies (QUICK-SET INFUSION 43" 9MM) MISC 1 Device by Does not apply route daily. 90 each 3  .  lisinopril (PRINIVIL,ZESTRIL) 30 MG tablet Take 1 tablet (30 mg total) by mouth at bedtime. 30 tablet 0  . metFORMIN (GLUCOPHAGE) 1000 MG tablet Take 1,000 mg by mouth 2 (two) times daily with a meal.     . metoprolol tartrate (LOPRESSOR) 25 MG tablet Take 25 mg by mouth 2 (two) times daily.    Marland Kitchen NOVOLIN R RELION 100 UNIT/ML injection INJECT 200 UNITS TOTAL (2MLS) SUBQ DAILY FOR PUMP     No current facility-administered medications on file prior to visit.     No Known Allergies  Family History  Problem Relation Age of Onset  . Diabetes Father   . Colon cancer Neg Hx   . Esophageal cancer Neg Hx   . Stomach cancer Neg Hx   . Rectal cancer Neg Hx   . Colon polyps Neg Hx     BP 130/60 (BP Location: Left Arm, Patient Position: Sitting, Cuff Size: Large)   Pulse 62   Ht 5\' 11"  (1.803 m)   Wt 288 lb (130.6 kg)   SpO2 98%   BMI 40.17 kg/m    Review of Systems He denies LOC    Objective:   Physical Exam VITAL SIGNS:  See vs page GENERAL: no distress Foot PE is declined.        Assessment & Plan:  Insulin-requiring type 2 DM, with PN: he needs increased rx.  Hypoglycemia: this limits aggressiveness of glycemic control Screening for colon cancer: he needs to adjust insulin for this.   Patient Instructions  check your blood sugar 4 times a day.  vary the time of day when you check, between before the 3 meals, and at bedtime.  also check if you have symptoms of your blood sugar being too high or too low.  please keep a record of the readings and bring it to your next appointment here (or you can bring the meter itself).  You can write it on any piece of paper.  please call us sooner if your blood sugar goes below 70, or if you have a lot of readings over 200. Please take these settings:  basal rate of 10 units/hr, except for 5 units/hr, 20:00-07:00.  bolus of 1 unit/1.7 grams carbohydrate.   continue correction bolus (which some people call "sensitivity," or "insulin sensitivity  ratio," or just "isr") of 1 unit for each 9  by which your glucose exceeds 100.  Suspend your pump for exercise. Because of the low blood sugar, you should resume your continuous glucose monitor.  Some specialists say that taking a high amount of folic acid (4-5 mg per day) helps the neuropathy.   The day before the colonoscopy, reduce all your settings in half.   The day of the colonoscopy, continue the half amount of basal.  Then resume the usual amounts when you are eating again.   Please come back for a follow-up appointment in 1 month.

## 2019-06-08 NOTE — Telephone Encounter (Signed)
John,   This patient is for colonoscopy with Dr.Pyrtle on 06/13/2019. Looks like he saw Cardiologist on 06/03/2019 for pre-op clearance before gastric bypass. He ordered Myocardial perfusion study before surgery but this is scheduled after colonoscopy. Ok to proceed with colonoscopy as scheduled? Please advise. Thank you, Lenise Jr pv

## 2019-06-09 NOTE — Telephone Encounter (Signed)
Per Dr Cordelia Pen office note on 06/08/19:  " check your blood sugar 4 times a day.  vary the time of day when you check, between before the 3 meals, and at bedtime.  also check if you have symptoms of your blood sugar being too high or too low.  please keep a record of the readings and bring it to your next appointment here (or you can bring the meter itself).  You can write it on any piece of paper.  please call us sooner if your blood sugar goes below 70, or if you have a lot of readings over 200. Please take these settings:  basal rate of 10 units/hr, except for 5 units/hr, 20:00-07:00.  bolus of 1 unit/1.7 grams carbohydrate.   continue correction bolus (which some people call "sensitivity," or "insulin sensitivity ratio," or just "isr") of 1 unit for each 9 by which your glucose exceeds 100.  Suspend your pump for exercise. Because of the low blood sugar, you should resume your continuous glucose monitor.  Some specialists say that taking a high amount of folic acid (4-5 mg per day) helps the neuropathy.   The day before the colonoscopy, reduce all your settings in half.   The day of the colonoscopy, continue the half amount of basal.  Then resume the usual amounts when you are eating again.   Please come back for a follow-up appointment in 1 month. "

## 2019-06-10 NOTE — Telephone Encounter (Signed)
Robbin,  This pt is cleared for anesthetic care at [facility].   Thanks,   Brentyn Seehafer 

## 2019-06-13 ENCOUNTER — Encounter: Payer: Self-pay | Admitting: Internal Medicine

## 2019-06-13 ENCOUNTER — Ambulatory Visit (AMBULATORY_SURGERY_CENTER): Payer: Medicare Other | Admitting: Internal Medicine

## 2019-06-13 ENCOUNTER — Other Ambulatory Visit: Payer: Self-pay

## 2019-06-13 VITALS — BP 115/61 | HR 49 | Temp 96.4°F | Resp 10 | Ht 72.0 in | Wt 289.0 lb

## 2019-06-13 DIAGNOSIS — Z1211 Encounter for screening for malignant neoplasm of colon: Secondary | ICD-10-CM

## 2019-06-13 DIAGNOSIS — D128 Benign neoplasm of rectum: Secondary | ICD-10-CM

## 2019-06-13 DIAGNOSIS — D124 Benign neoplasm of descending colon: Secondary | ICD-10-CM | POA: Diagnosis not present

## 2019-06-13 DIAGNOSIS — D125 Benign neoplasm of sigmoid colon: Secondary | ICD-10-CM | POA: Diagnosis not present

## 2019-06-13 DIAGNOSIS — D123 Benign neoplasm of transverse colon: Secondary | ICD-10-CM

## 2019-06-13 MED ORDER — SODIUM CHLORIDE 0.9 % IV SOLN: 500.0000 mL | Freq: Once | INTRAVENOUS | Status: DC

## 2019-06-13 NOTE — Progress Notes (Signed)
Report to PACU, RN, vss, BBS= Clear.  

## 2019-06-13 NOTE — Op Note (Signed)
[place] Patient Name: Jesse Guerrero Procedure Date: 06/13/2019 8:08 AM MRN: [id number] Endoscopist: Jerene Bears , MD Age: 68 Referring MD:  Date of Birth: 1950-12-05 Gender: Male Account #: [id number] Procedure:                Colonoscopy Indications:              Screening for colorectal malignant neoplasm, Last                            colonoscopy 10 years ago Medicines:                Monitored Anesthesia Care Procedure:                Pre-Anesthesia Assessment:                           - Prior to the procedure, a History and Physical                            was performed, and patient medications and                            allergies were reviewed. The patient's tolerance of                            previous anesthesia was also reviewed. The risks                            and benefits of the procedure and the sedation                            options and risks were discussed with the patient.                            All questions were answered, and informed consent                            was obtained. Prior Anticoagulants: The patient has                            taken no previous anticoagulant or antiplatelet                            agents. ASA Grade Assessment: II - A patient with                            mild systemic disease. After reviewing the risks                            and benefits, the patient was deemed in                            satisfactory condition to undergo the procedure.  After obtaining informed consent, the colonoscope                            was passed under direct vision. Throughout the                            procedure, the patient's blood pressure, pulse, and                            oxygen saturations were monitored continuously. The                            Colonoscope was introduced through the anus and                            advanced to the cecum, identified by  appendiceal                            orifice and ileocecal valve. The colonoscopy was                            performed without difficulty. The patient tolerated                            the procedure well. The quality of the bowel                            preparation was good. The ileocecal valve,                            appendiceal orifice, and rectum were photographed. Scope In: 8:11:24 AM Scope Out: 8:38:48 AM Scope Withdrawal Time: 0 hours 21 minutes 9 seconds  Total Procedure Duration: 0 hours 27 minutes 24 seconds  Findings:                 The digital rectal exam was normal.                           Two sessile polyps were found in the transverse                            colon. The polyps were 4 to 6 mm in size. These                            polyps were removed with a cold snare. Resection                            and retrieval were complete.                           Four sessile polyps were found in the descending                            colon. The polyps were 3 to  6 mm in size. These                            polyps were removed with a cold snare. Resection                            and retrieval were complete.                           Two sessile polyps were found in the sigmoid colon.                            The polyps were 5 to 6 mm in size. These polyps                            were removed with a cold snare. Resection and                            retrieval were complete.                           A 7 mm polyp was found in the rectum. The polyp was                            sessile. The polyp was removed with a cold snare.                            Resection and retrieval were complete.                           There were small to medium-sized lipomas, in the                            sigmoid colon and in the ascending colon.                           Scattered small-mouthed diverticula were found in                            the  sigmoid colon, descending colon and ascending                            colon.                           Internal hemorrhoids were found during                            retroflexion. The hemorrhoids were small. Complications:            No immediate complications. Estimated Blood Loss:     Estimated blood loss was minimal. Impression:               - Two 4 to 6 mm polyps in the transverse colon,  removed with a cold snare. Resected and retrieved.                           - Four 3 to 6 mm polyps in the descending colon,                            removed with a cold snare. Resected and retrieved.                           - Two 5 to 6 mm polyps in the sigmoid colon,                            removed with a cold snare. Resected and retrieved.                           - One 7 mm polyp in the rectum, removed with a cold                            snare. Resected and retrieved.                           - Diverticulosis in the sigmoid colon, in the                            descending colon and in the ascending colon.                           - Internal hemorrhoids. Recommendation:           - Patient has a contact number available for                            emergencies. The signs and symptoms of potential                            delayed complications were discussed with the                            patient. Return to normal activities tomorrow.                            Written discharge instructions were provided to the                            patient.                           - Resume previous diet.                           - Continue present medications.                           - Await pathology results.                           -  Repeat colonoscopy is recommended for                            surveillance. The colonoscopy date will be                            determined after pathology results from today's                             exam become available for review. Jerene Bears, MD 06/13/2019 8:46:18 AM This report has been signed electronically.

## 2019-06-13 NOTE — Progress Notes (Signed)
Pt's states no medical or surgical changes since previsit or office visit.  YF temps, [place] vitals, CM IV

## 2019-06-13 NOTE — Progress Notes (Signed)
Called to room to assist during endoscopic procedure.  Patient ID and intended procedure confirmed with present staff. Received instructions for my participation in the procedure from the performing physician.  

## 2019-06-13 NOTE — Patient Instructions (Signed)
HANDOUTS PROVIDED ON: POLYPS, DIVERTICULOSIS, & HEMORRHOIDS  THE POLYPS REMOVED TODAY HAVE BEEN SENT FOR PATHOLOGY.  THE RESULTS CAN TAKE 2-3 WEEKS TO RECEIVE.  THE TIMEFRAME BEFORE YOUR NEXT COLONOSCOPY WILL BE DETERMINED BASED ON THE PATHOLOGY RESULTS.    YOU MAY RESUME YOUR PREVIOUS DIET AND MEDICATION SCHEDULE.  [place] YOU FOR ALLOWING [place] TO CARE FOR YOU TODAY!!  YOU HAD AN ENDOSCOPIC PROCEDURE TODAY AT [facility]:   Refer to the procedure report that was given to you for any specific questions about what was found during the examination.  If the procedure report does not answer your questions, please call your gastroenterologist to clarify.  If you requested that your care partner not be given the details of your procedure findings, then the procedure report has been included in a sealed envelope for you to review at your convenience later.  YOU SHOULD EXPECT: Some feelings of bloating in the abdomen. Passage of more gas than usual.  Walking can help get rid of the air that was put into your GI tract during the procedure and reduce the bloating. If you had a lower endoscopy (such as a colonoscopy or flexible sigmoidoscopy) you may notice spotting of blood in your stool or on the toilet paper. If you underwent a bowel prep for your procedure, you may not have a normal bowel movement for a few days.  Please Note:  You might notice some irritation and congestion in your nose or some drainage.  This is from the oxygen used during your procedure.  There is no need for concern and it should clear up in a day or so.  SYMPTOMS TO REPORT IMMEDIATELY:   Following lower endoscopy (colonoscopy or flexible sigmoidoscopy):  Excessive amounts of blood in the stool  Significant tenderness or worsening of abdominal pains  Swelling of the abdomen that is new, acute  Fever of 100F or higher  For urgent or emergent issues, a gastroenterologist can be reached at any hour by calling [phone]  [phone].   DIET:  We do recommend a small meal at first, but then you may proceed to your regular diet.  Drink plenty of fluids but you should avoid alcoholic beverages for 24 hours.  ACTIVITY:  You should plan to take it easy for the rest of today and you should NOT DRIVE or use heavy machinery until tomorrow (because of the sedation medicines used during the test).    FOLLOW UP: Our staff will call the number listed on your records 48-72 hours following your procedure to check on you and address any questions or concerns that you may have regarding the information given to you following your procedure. If we do not reach you, we will leave a message.  We will attempt to reach you two times.  During this call, we will ask if you have developed any symptoms of COVID 19. If you develop any symptoms (ie: fever, flu-like symptoms, shortness of breath, cough etc.) before then, please call [phone].  If you test positive for Covid 19 in the 2 weeks post procedure, please call and report this information to [place].    If any biopsies were taken you will be contacted by phone or by letter within the next 1-3 weeks.  Please call us at [phone] if you have not heard about the biopsies in 3 weeks.    SIGNATURES/CONFIDENTIALITY: You and/or your care partner have signed paperwork which will be entered into your electronic medical record.  These  signatures attest to the fact that that the information above on your After Visit Summary has been reviewed and is understood.  Full responsibility of the confidentiality of this discharge information lies with you and/or your care-partner. 

## 2019-06-15 ENCOUNTER — Ambulatory Visit: Payer: Medicare Other | Admitting: Endocrinology

## 2019-06-15 ENCOUNTER — Encounter: Payer: Medicare Other | Attending: Internal Medicine | Admitting: Dietician

## 2019-06-15 ENCOUNTER — Encounter: Payer: Self-pay | Admitting: Dietician

## 2019-06-15 ENCOUNTER — Telehealth: Payer: Self-pay

## 2019-06-15 ENCOUNTER — Other Ambulatory Visit: Payer: Self-pay

## 2019-06-15 DIAGNOSIS — E669 Obesity, unspecified: Secondary | ICD-10-CM

## 2019-06-15 DIAGNOSIS — Z794 Long term (current) use of insulin: Secondary | ICD-10-CM | POA: Insufficient documentation

## 2019-06-15 DIAGNOSIS — E1165 Type 2 diabetes mellitus with hyperglycemia: Secondary | ICD-10-CM | POA: Insufficient documentation

## 2019-06-15 DIAGNOSIS — E1142 Type 2 diabetes mellitus with diabetic polyneuropathy: Secondary | ICD-10-CM

## 2019-06-15 NOTE — Telephone Encounter (Signed)
  Follow up Call-  Call back number 06/13/2019  Post procedure Call Back phone  # [phone]  Permission to leave phone message Yes     Patient questions:  Do you have a fever, pain , or abdominal swelling? No. Pain Score  0 *  Have you tolerated food without any problems? Yes.    Have you been able to return to your normal activities? Yes.    Do you have any questions about your discharge instructions: Diet   No. Medications  No. Follow up visit  No.  Do you have questions or concerns about your Care? No.  Actions: * If pain score is 4 or above: No action needed, pain <4. 1. Have you developed a fever since your procedure? no  2.   Have you had an respiratory symptoms (SOB or cough) since your procedure? no  3.   Have you tested positive for COVID 19 since your procedure no  4.   Have you had any family members/close contacts diagnosed with the COVID 19 since your procedure?  no   If yes to any of these questions please route to Joylene John, RN and Alphonsa Gin, Therapist, sports.

## 2019-06-15 NOTE — Progress Notes (Signed)
Nutrition Assessment for Bariatric Surgery Medical Nutrition Therapy  Appt Start Time: 9:10am    End Time: 10:20am  Patient was seen on 06/15/2019 for Pre-Operative Nutrition Assessment. Letter of approval faxed to [facility] bariatric surgery program coordinator on 06/15/2019.   Referral stated Supervised Weight Loss (SWL) visits needed: 6  (Some may have already been completed w/ PCP)  Planned surgery: RYGB  Pt expectation of surgery: to improve diabetes, high blood pressure and cholesterol, maintain weight loss Pt expectation of dietitian: guidance for how to eat healthier    NUTRITION ASSESSMENT   Anthropometrics  Start weight at [facility]: 289 lbs (date: 06/15/2019) Height: 72 in BMI: 39.2 kg/m2    Clinical  Medical hx: obesity, sleep apnea, HTN, hepatitis, hyperlipidemia, GERD, T2DM, asthma, thumb surgery, cholecystectomy, back surgery  Medications: gabapentin, aspirin, atorvastatin calcium, esomeprazole magnesium, hydralazine, humulin R, lisinopril, metformin, metoprolol tartrate   Labs: A1c 8.8% (as of 05/12/2019)     Lifestyle & Dietary Hx Pt lives alone, his wife passed away last year due to a brain tumor. Has kids and grandkids he is close to. Very personable, and is motivated to make life changes to be successful. Retired, but works seasonally as a Musician.    Used to be very active, played sports and hiked [employer] as a young adult, participated in martial arts. Still likes swimming. Likes cooking, will grill 3x/week. Sometimes will eat eggs and bacon with breakfast. For lunch may have cheese and crackers, or takeout burger with onion rings. 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 Drinks lots of water. Rarely drinks alcohol (socially ~5-6 x/year.)   24-Hr Dietary Recall First Meal: 1/2 banana + orange + sesame bagel w/ butter & cream cheese  Snack: -  Second Meal: chicken salad sandwich  Snack: dark chocolate  Third Meal: steak + mashed potatoes  + corn  Snack: ice cream + popcorn  Beverages: water (10-14 bottles/day), 1% milk  Estimated Energy Needs Calories: 2000 Carbohydrate: 225g Protein: 125g Fat: 67g   NUTRITION DIAGNOSIS  Overweight/obesity ([place]-3.3) related to past poor dietary habits and physical inactivity as evidenced by patient w/ planned RYGB surgery following dietary guidelines for continued weight loss.    NUTRITION INTERVENTION  Nutrition counseling (C-1) and education (E-2) to facilitate bariatric surgery goals.  Pre-Op Goals Reviewed with the Patient . Track food and beverage intake (pen and paper, MyFitness Pal, Baritastic app, etc.) . Make healthy food choices while monitoring portion sizes . Consume 3 meals per day or try to eat every 3-5 hours . Avoid concentrated sugars and fried foods . Keep sugar & fat in the single digits per serving on food labels  *Goals that are bolded indicate the pt would like to start working towards these  Handouts Provided Include  . Breakfast Ideas  . MyPlate & Meal Ideas  Learning Style & Readiness for Change Teaching method utilized: Visual & Auditory  Demonstrated degree of understanding via: Teach Back  Barriers to learning/adherence to lifestyle change: None Identified  RD's Notes for Next Visit . Revisit balanced breakfasts  . Carb counting and incorporating protein with all meals/snacks  . Pick up with Pre-Op Goals at chewing    MONITORING & EVALUATION Dietary intake, weekly physical activity, body weight, and pre-op goals reached at next nutrition visit.   Next Steps Patient is to return to [facility] in 1 month for 1st SWL Visit.  Patient is to call [facility] to enroll in Pre-Op Class (>2 weeks before surgery) and  Post-Op Class (2 weeks after surgery) for further nutrition education when surgery date is scheduled.

## 2019-06-15 NOTE — Telephone Encounter (Signed)
Follow up call attempted.  NALM  

## 2019-06-15 NOTE — Patient Instructions (Signed)
Remember your goal to work on this month:   Have a balanced breakfast: include carbohydrate food(s) and protein food(s). Keep in mind some of your ideas such as only eating half of the bagel, using peanut butter instead of cream cheese, and having eggs with your raisin bran cereal. Use your Breakfast Ideas sheet for more ideas!   See you next month at your supervised visit!

## 2019-06-16 ENCOUNTER — Telehealth (HOSPITAL_COMMUNITY): Payer: Self-pay | Admitting: *Deleted

## 2019-06-16 ENCOUNTER — Encounter: Payer: Self-pay | Admitting: Internal Medicine

## 2019-06-16 NOTE — Telephone Encounter (Signed)
Close encounter 

## 2019-06-21 ENCOUNTER — Other Ambulatory Visit: Payer: Self-pay

## 2019-06-21 ENCOUNTER — Ambulatory Visit (HOSPITAL_COMMUNITY)
Admission: RE | Admit: 2019-06-21 | Discharge: 2019-06-21 | Disposition: A | Discharge status: Home / Self Care | Payer: Medicare Other | Source: Ambulatory Visit | Attending: Cardiology | Admitting: Cardiology

## 2019-06-21 DIAGNOSIS — Z794 Long term (current) use of insulin: Secondary | ICD-10-CM | POA: Diagnosis not present

## 2019-06-21 DIAGNOSIS — Z8249 Family history of ischemic heart disease and other diseases of the circulatory system: Secondary | ICD-10-CM | POA: Insufficient documentation

## 2019-06-21 DIAGNOSIS — Z01810 Encounter for preprocedural cardiovascular examination: Secondary | ICD-10-CM

## 2019-06-21 DIAGNOSIS — E119 Type 2 diabetes mellitus without complications: Secondary | ICD-10-CM | POA: Diagnosis not present

## 2019-06-21 DIAGNOSIS — Z01818 Encounter for other preprocedural examination: Secondary | ICD-10-CM | POA: Insufficient documentation

## 2019-06-21 DIAGNOSIS — I10 Essential (primary) hypertension: Secondary | ICD-10-CM | POA: Diagnosis not present

## 2019-06-21 DIAGNOSIS — E669 Obesity, unspecified: Secondary | ICD-10-CM | POA: Diagnosis not present

## 2019-06-21 MED ORDER — REGADENOSON 0.4 MG/5ML IV SOLN
0.4000 mg | Freq: Once | INTRAVENOUS | Status: AC
  Administered 2019-06-21: 0.4 mg via INTRAVENOUS

## 2019-06-21 MED ORDER — TECHNETIUM TC 99M TETROFOSMIN IV KIT
27.2000 | PACK | Freq: Once | INTRAVENOUS | Status: AC | PRN
  Administered 2019-06-21: 27.2 via INTRAVENOUS
  Filled 2019-06-21: qty 28

## 2019-06-22 ENCOUNTER — Ambulatory Visit (HOSPITAL_COMMUNITY)
Admission: RE | Admit: 2019-06-22 | Discharge: 2019-06-22 | Disposition: A | Discharge status: Home / Self Care | Payer: Medicare Other | Source: Ambulatory Visit | Attending: Cardiology | Admitting: Cardiology

## 2019-06-22 LAB — MYOCARDIAL PERFUSION IMAGING
LV dias vol: 171 mL (ref 62–150)
LV sys vol: 81 mL
Peak HR: 68 {beats}/min
Rest HR: 52 {beats}/min
SDS: 0
SRS: 3
SSS: 3
TID: 0.92

## 2019-06-22 MED ORDER — TECHNETIUM TC 99M TETROFOSMIN IV KIT
27.4000 | PACK | Freq: Once | INTRAVENOUS | Status: AC | PRN
  Administered 2019-06-22: 27.4 via INTRAVENOUS

## 2019-07-12 ENCOUNTER — Other Ambulatory Visit: Payer: Self-pay

## 2019-07-12 ENCOUNTER — Encounter: Payer: Medicare Other | Attending: Internal Medicine | Admitting: Skilled Nursing Facility1

## 2019-07-12 ENCOUNTER — Encounter: Payer: Self-pay | Admitting: Skilled Nursing Facility1

## 2019-07-12 ENCOUNTER — Ambulatory Visit: Payer: Medicare Other | Admitting: Endocrinology

## 2019-07-12 DIAGNOSIS — E1165 Type 2 diabetes mellitus with hyperglycemia: Secondary | ICD-10-CM | POA: Insufficient documentation

## 2019-07-12 DIAGNOSIS — Z794 Long term (current) use of insulin: Secondary | ICD-10-CM | POA: Insufficient documentation

## 2019-07-12 DIAGNOSIS — E669 Obesity, unspecified: Secondary | ICD-10-CM

## 2019-07-12 NOTE — Progress Notes (Signed)
Supervised Weight Loss Visit Bariatric Nutrition Education  Planned Surgery: RYGB  Pt Expectation of Surgery/ Goals: Weight loss  1 out of 6 SWL Appointments    NUTRITION ASSESSMENT  Pt reports working on his breakfast. Peanut butter bagel instead of cream cheese, with an orange and half banana. Reports stopping eating ice cream this past month, which he would eat daily. Pt reported that he did not like removing ice cream from his diet. These changes haven't changed blood sugar readings.  Fasting BG of 200, evening BG of 300  Has CGM and insulin pump. States physician recently changed his insulin to a more affordable version, which pt. States doesn't work as well. Reported an A1c over 8 last 2 months. 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 Blood sugar has gotten better since starting work. 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 Goes to sleep at 8 PM and wakes up at midnight, unable to go back to sleep. Pt reports carrying no snacks. Reports bouts of hypoglycemia twice in the past 2 weeks (55, 70). Did not eat lunch during those times. Removes pump while working, believes it contributes to high blood sugar. 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 Pt expresses wanting to get surgery ASAP. Pt reports attempting to eat a vegetable with every dinner. Lives alone and reports not wanting to waste food. Reports having a limited taste for vegetables, doesn't like many. Pt. Questioned applesauce being a vegetables, and had a hard time believing that applesauce was nit a non-starchy vegetable at conclusion of counseling.  Anthropometrics   Start weight at [facility]: 289 lbs (date: 06/15/2019) Today's weight: 291.5 lbs Weight change: +2 lbs (since previous visit on 06/15/2019) BMI: 40.66 kg/m2    Clinical  Medical Hx: obesity, sleep apnea, HTN,  hepatitis, hyperlipidemia, GERD, T2DM, asthma, thumb surgery, cholecystectomy, back surgery  Medications: See list Labs: A1c 8.8% (as of 05/12/2019) Notable Signs/Symptoms: 2 hypoglycemic episodes (unclear whether symptomatic)  Lifestyle & Dietary Hx  Estimated daily fluid intake: Pt reports drinking lots of water daily. Supplements: none reported GI / other notable symptoms: none reported Current average weekly physical activity: Swam consistently before taking job, looks forward top being able to return to swimming after work ends.  24-Hr Dietary Recall First Meal: Bagel and peanut butter, orange, half banana Snack: hard candy while working [employer]: Skips lunch Snack: hard candy while wroking Third Meal: Ham steak, apple sauce, mac and cheese, big salad (blue cheese, raisins, cucumbers, onions, tomatoes, celery). Steak, mashed potatoes, peas Snack: Candy bar (30 min after dinner) Beverages: water  NUTRITION DIAGNOSIS  Overweight/obesity ([place]-3.3) related to past poor dietary habits and physical inactivity as evidenced by patient w/ planned RYGB surgery following dietary guidelines for continued weight loss.   NUTRITION INTERVENTION  Nutrition counseling (C-1) and education (E-2) to facilitate bariatric surgery goals.  Pre-Op Goals Progress & New Goals . Do not remove insulin pump during the day. . Work with endocrinologist to dial in insulin dosing . Keep a snack (granola bar/nature valley protein)/protein shake on you at work. . Work up to incorporating non-starchy vegetables 2 times a day, 7 days a week. o Introduce variety into salads. . Monitor carbohydrate intake.  Handouts Provided Include   Meal Ideas  Learning Style & Readiness for Change Teaching method utilized: Visual & Auditory  Demonstrated degree of  understanding via: Teach Back  Barriers to learning/adherence to lifestyle change: Resistance to change  RD's Notes for next Visit  . Monitor insulin dosing  and blood glucose levels . Check A1c labs   MONITORING & EVALUATION Dietary intake, weekly physical activity, body weight, and pre-op goals in 1 month.   Next Steps  Patient is to return to [facility] in 1 month for second SWL.

## 2019-08-02 ENCOUNTER — Telehealth: Payer: Self-pay

## 2019-08-02 NOTE — Telephone Encounter (Signed)
Company: [employer]  Document: Medical records request Other records requested: Most recent office notes  All above requested information has been faxed successfully to [employer] listed above. Documents and fax confirmation have been placed in the faxed file for future reference.

## 2019-08-05 ENCOUNTER — Telehealth: Payer: Self-pay

## 2019-08-05 NOTE — Telephone Encounter (Signed)
Received notification from [employer] indicating that request for supplies have been denied. Further adding, new face to face visit must indicate pt is testing CBG's 4x's per day along with documentation (CBG logs) that support. Per Dr. Loanne Drilling, pt will require a new appt according to [employer]'s guidelines and suggestions. Routing to front desk for scheduling purposes.

## 2019-08-05 NOTE — Telephone Encounter (Signed)
[employer] paperwork placed on Dr. Cordelia Pen desk for him to address at this appt

## 2019-08-05 NOTE — Telephone Encounter (Signed)
Patient is scheduled for an appointment on 08/24/19 at 8:00 a.m.

## 2019-08-09 ENCOUNTER — Encounter: Payer: Medicare Other | Attending: Internal Medicine | Admitting: Dietician

## 2019-08-09 ENCOUNTER — Encounter: Payer: Self-pay | Admitting: Dietician

## 2019-08-09 ENCOUNTER — Other Ambulatory Visit: Payer: Self-pay

## 2019-08-09 DIAGNOSIS — Z794 Long term (current) use of insulin: Secondary | ICD-10-CM | POA: Diagnosis present

## 2019-08-09 DIAGNOSIS — E1165 Type 2 diabetes mellitus with hyperglycemia: Secondary | ICD-10-CM | POA: Insufficient documentation

## 2019-08-09 DIAGNOSIS — E1142 Type 2 diabetes mellitus with diabetic polyneuropathy: Secondary | ICD-10-CM

## 2019-08-09 DIAGNOSIS — E669 Obesity, unspecified: Secondary | ICD-10-CM

## 2019-08-09 NOTE — Patient Instructions (Signed)
Great job with including more non-starchy vegetables and having snacks/lunch at work! In addition to the goals you have been working on, remember your new goal this month:   Reduce your portion size at dinner. Use a smaller plate to eat off of, or serve yourself a smaller portion.

## 2019-08-09 NOTE — Progress Notes (Signed)
Supervised Weight Loss Visit Bariatric Nutrition Education  Planned Surgery: RYGB  Pt Expectation of Surgery/ Goals: to improve diabetes, high blood pressure and cholesterol, maintain weight loss  2nd out of 6 SWL Appointments    NUTRITION ASSESSMENT  Anthropometrics   Start weight at [facility]: 289 lbs (date: 06/15/2019) Today's weight: 290 lbs BMI: 40.5 kg/m2    Lifestyle & Dietary Hx Pt states he now keeps a snack with him at work, may have a granola bar or crackers. 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 Eats a salad about 3 times per week now. States he doesn't like many non-starchy vegetables so it has been hard trying to eat more of these. States A1c is down to ~7.5%.  Current average weekly physical activity: work (walking a lot)   24-Hr Dietary Recall First Meal: bagel + peanut butter + orange  Snack: - Second Meal: PB&J sandwich  Snack: granola bar Third Meal: grilled ham  Snack: dark chocolate Beverages: water   NUTRITION DIAGNOSIS  Overweight/obesity ([place]-3.3) related to past poor dietary habits and physical inactivity as evidenced by patient w/ planned RYGB surgery following dietary guidelines for continued weight loss.   NUTRITION INTERVENTION  Nutrition counseling (C-1) and education (E-2) to facilitate bariatric surgery goals.  Pre-Op Goals Progress & New Goals . Do not remove insulin pump during the day. . Work with endocrinologist to dial in insulin dosing . Keep a snack (granola bar/nature valley protein bar/protein shake) on you at work. . Work up to incorporating non-starchy vegetables 2 times a day, 7 days a week. o Introduce variety into salads. . Monitor carbohydrate intake. . NEW: Work on smaller portions at supper (try eating off of smaller plates or serving smaller amounts)   Learning Style & Readiness for Change Teaching method utilized: Visual & Auditory  Demonstrated degree of understanding via: Teach  Back    MONITORING & EVALUATION Dietary intake, weekly physical activity, body weight, and pre-op goals in 1 month.   Next Steps  Patient is to return to [facility] in 1 month for 3rd SWL.

## 2019-08-15 ENCOUNTER — Telehealth: Payer: Self-pay

## 2019-08-15 NOTE — Telephone Encounter (Signed)
Medicare called in about appeal for patient being done and held on Sep 01, 2019 at [place] time. By telephone

## 2019-08-24 ENCOUNTER — Ambulatory Visit: Payer: Medicare Other | Admitting: Endocrinology

## 2019-09-06 ENCOUNTER — Encounter: Payer: Medicare Other | Attending: Internal Medicine | Admitting: Dietician

## 2019-09-06 ENCOUNTER — Other Ambulatory Visit: Payer: Self-pay

## 2019-09-06 ENCOUNTER — Encounter: Payer: Self-pay | Admitting: Dietician

## 2019-09-06 DIAGNOSIS — Z794 Long term (current) use of insulin: Secondary | ICD-10-CM | POA: Insufficient documentation

## 2019-09-06 DIAGNOSIS — E1165 Type 2 diabetes mellitus with hyperglycemia: Secondary | ICD-10-CM | POA: Insufficient documentation

## 2019-09-06 DIAGNOSIS — E1142 Type 2 diabetes mellitus with diabetic polyneuropathy: Secondary | ICD-10-CM

## 2019-09-06 NOTE — Patient Instructions (Signed)
   Continue eating off of smaller plates to help with portion sizes at dinner.   Incorporate swimming as much as possible to stay physically active.   Practice chewing food very thoroughly! Chew each bite until it is like applesauce before swallowing.

## 2019-09-06 NOTE — Progress Notes (Signed)
Supervised Weight Loss Visit Bariatric Nutrition Education  Planned Surgery: RYGB  Pt Expectation of Surgery/ Goals: to improve diabetes, high blood pressure and cholesterol, maintain weight loss  3rd out of 6 SWL Appointments    NUTRITION ASSESSMENT  Anthropometrics   Start weight at [facility]: 289 lbs (date: 06/15/2019) Today's weight: 291.7 lbs BMI: 40.7 kg/m2    Lifestyle & Dietary Hx Patient states the last month has been difficult for him related to family/personal things going on. States he was surprised he only gained a pound and a half since last nutrition visit, thought he gained 10. May have fast food for lunch. States that, usually, using smaller plates works for him but that he finds himself pilling up lots of food onto his small plates so that he can eat more. States he is a really good cook and loves eating which makes it hard for him to monitor portion sizes. Continues to do well with meeting fluid goal (drinks lots of water.) Plans to start swimming again this week and is looking forward to this.    Current average weekly physical activity: work (walking a lot)   24-Hr Dietary Recall First Meal: bagel + peanut butter + orange + 1/2 banana Snack: - Second Meal: chicken salad sandwich Snack: granola bar Third Meal: grilled ham + mashed potatoes + applesauce Snack: dark chocolate Beverages: water   NUTRITION DIAGNOSIS  Overweight/obesity ([place]-3.3) related to past poor dietary habits and physical inactivity as evidenced by patient w/ planned RYGB surgery following dietary guidelines for continued weight loss.   NUTRITION INTERVENTION  Nutrition counseling (C-1) and education (E-2) to facilitate bariatric surgery goals.  Pre-Op Goals Progress & New Goals . Do not remove insulin pump during the day. . Working with endocrinologist to dial in insulin dosing . Keeps a snack to have at work. . Working on incorporating non-starchy vegetables 2 times a day, 7 days a  week. o Introduce variety into salads. . Monitor carbohydrate intake. . Working on smaller portions at supper (try eating off of smaller plates or serving smaller amounts) . NEW: Practice chewing food thoroughly.    Learning Style & Readiness for Change Teaching method utilized: Visual & Auditory  Demonstrated degree of understanding via: Teach Back    MONITORING & EVALUATION Dietary intake, weekly physical activity, body weight, and pre-op goals in 1 month.   Next Steps  Patient is to return to [facility] in 1 month for 4th SWL.

## 2019-09-07 ENCOUNTER — Other Ambulatory Visit: Payer: Self-pay

## 2019-09-09 ENCOUNTER — Other Ambulatory Visit: Payer: Self-pay

## 2019-09-09 ENCOUNTER — Encounter: Payer: Self-pay | Admitting: Endocrinology

## 2019-09-09 ENCOUNTER — Ambulatory Visit (INDEPENDENT_AMBULATORY_CARE_PROVIDER_SITE_OTHER): Payer: Medicare Other | Admitting: Endocrinology

## 2019-09-09 VITALS — BP 138/78 | HR 70 | Ht 71.0 in | Wt 295.4 lb

## 2019-09-09 DIAGNOSIS — Z794 Long term (current) use of insulin: Secondary | ICD-10-CM | POA: Diagnosis not present

## 2019-09-09 DIAGNOSIS — E1142 Type 2 diabetes mellitus with diabetic polyneuropathy: Secondary | ICD-10-CM

## 2019-09-09 LAB — POCT GLYCOSYLATED HEMOGLOBIN (HGB A1C): Hemoglobin A1C: 8.6 % — AB (ref 4.0–5.6)

## 2019-09-09 MED ORDER — NOVOLIN R RELION 100 UNIT/ML IJ SOLN: INTRAMUSCULAR | 11 refills | Status: DC

## 2019-09-09 NOTE — Patient Instructions (Addendum)
check your blood sugar 4 times a day.  vary the time of day when you check, between before the 3 meals, and at bedtime.  also check if you have symptoms of your blood sugar being too high or too low.  please keep a record of the readings and bring it to your next appointment here (or you can bring the meter itself).  You can write it on any piece of paper.  please call us sooner if your blood sugar goes below 70, or if you have a lot of readings over 200. Please take these settings:  basal rate of 10 units/hr, except for 5 units/hr, 20:00-07:00.  bolus of 1 unit/1.7 grams carbohydrate (except add 10 units to lunch and supper).   continue correction bolus (which some people call "sensitivity," or "insulin sensitivity ratio," or just "isr") of 1 unit for each 9 by which your glucose exceeds 100.   Suspend your pump for exercise.   Blood tests (for the pump) are requested for you today.  We'll let you know about the results.   For the continuous glucose monitor, they need 1 month of blood sugars, checked 4 times per day, so we can send to them.   Please come back for a follow-up appointment in 2 months.

## 2019-09-09 NOTE — Progress Notes (Signed)
Subjective:    Patient ID: Jesse Guerrero, male    DOB: [birth year], 69 y.o.   MRN: [id number]  HPI Pt returns for f/u of diabetes mellitus: DM type: Insulin-requiring type 2.   Dx'ed: 0000000 Complications: polyneuropathy and nephropathy.   Therapy: insulin since 2004 DKA: never Severe hypoglycemia: last episode was in 2012.   Pancreatitis: never Pancreatic imaging: normal on 2017 CT Other: he has a medtronic minimed pump (530G), and G6 continuous glucose monitor.  he takes human insulin U-100, due to cost.   Interval history: He takes these settings:  basal rate of 10 units/hr, except for 5 units/hr, 20:00-07:00.  bolus of 1 unit/1.7 grams carbohydrate.   continue correction bolus (which some people call "sensitivity," or "insulin sensitivity ratio," or just "isr") of 1 unit for each 9 by which your glucose exceeds 100.  Suspends pump for exercise. continuous glucose monitor is downloaded today, and the printout is scanned into the record.  glucose varies from 150-370.  It is in general highest after lunch and after supper Pump cannot be downloaded today Past Medical History:  Diagnosis Date  . Asthma    as child  . Diabetes mellitus without complication ([place])   . GERD (gastroesophageal reflux disease)   . Hepatitis C   . High cholesterol   . Hypertension   . Sleep apnea    wears cpap     Past Surgical History:  Procedure Laterality Date  . BACK SURGERY    . CHOLECYSTECTOMY    . COLONOSCOPY      x 3 per pt - all normal - last colon 10 yrs ago either in [place] or [place]  . thumb surgery Left     Social History   Socioeconomic History  . Marital status: Widowed    Spouse name: Not on file  . Number of children: Not on file  . Years of education: Not on file  . Highest education level: Not on file  Occupational History  . Not on file  Tobacco Use  . Smoking status: Former Research scientist (life sciences)  . Smokeless tobacco: Never Used  . Tobacco comment: Quit 20 years ago    Substance and Sexual Activity  . Alcohol use: Yes    Comment: Occasionally   . Drug use: No  . Sexual activity: Not on file  Other Topics Concern  . Not on file  Social History Narrative  . Not on file   Social Determinants of Health   Financial Resource Strain:   . Difficulty of Paying Living Expenses: Not on file  Food Insecurity:   . Worried About Charity fundraiser in the Last Year: Not on file  . Ran Out of Food in the Last Year: Not on file  Transportation Needs:   . Lack of Transportation (Medical): Not on file  . Lack of Transportation (Non-Medical): Not on file  Physical Activity:   . Days of Exercise per Week: Not on file  . Minutes of Exercise per Session: Not on file  Stress:   . Feeling of Stress : Not on file  Social Connections:   . Frequency of Communication with Friends and Family: Not on file  . Frequency of Social Gatherings with Friends and Family: Not on file  . Attends Religious Services: Not on file  . Active Member of Clubs or Organizations: Not on file  . Attends Archivist Meetings: Not on file  . Marital Status: Not on file  Intimate Partner Violence:   .  Fear of Current or Ex-Partner: Not on file  . Emotionally Abused: Not on file  . Physically Abused: Not on file  . Sexually Abused: Not on file    Current Outpatient Medications on File Prior to Visit  Medication Sig Dispense Refill  . aspirin EC 81 MG tablet Take 81 mg by mouth at bedtime.     Marland Kitchen atorvastatin (LIPITOR) 40 MG tablet Take 40 mg by mouth at bedtime.     . Continuous Blood Gluc Sensor (DEXCOM G6 SENSOR) MISC 1 each by Does not apply route See admin instructions. For use with continuous glucose monitoring system. Change sensor every 10 days; E11.9 3 each 2  . esomeprazole (NEXIUM) 20 MG capsule Take 20 mg by mouth at bedtime.     . gabapentin (NEURONTIN) 300 MG capsule Take 600-900 mg by mouth See admin instructions. Take 600 mg by mouth in the morning and 900 mg at  bedtime    . hydrALAZINE (APRESOLINE) 25 MG tablet Take 25 mg by mouth 3 (three) times daily as needed (for a B/P >180/100).    . Insulin Infusion Pump Supplies (PARADIGM RESERVOIR 3ML) MISC 1 Device by Does not apply route daily. 90 each 3  . Insulin Infusion Pump Supplies (QUICK-SET INFUSION 43" 9MM) MISC 1 Device by Does not apply route daily. 90 each 3  . lisinopril (PRINIVIL,ZESTRIL) 30 MG tablet Take 1 tablet (30 mg total) by mouth at bedtime. 30 tablet 0  . metFORMIN (GLUCOPHAGE) 1000 MG tablet Take 1,000 mg by mouth 2 (two) times daily with a meal.     . metoprolol tartrate (LOPRESSOR) 25 MG tablet Take 25 mg by mouth 2 (two) times daily.     No current facility-administered medications on file prior to visit.    No Known Allergies  Family History  Problem Relation Age of Onset  . Diabetes Father   . Colon cancer Neg Hx   . Esophageal cancer Neg Hx   . Stomach cancer Neg Hx   . Rectal cancer Neg Hx   . Colon polyps Neg Hx     BP 138/78 (BP Location: Left Arm, Patient Position: Sitting, Cuff Size: Large)   Pulse 70   Ht 5\' 11"  (1.803 m)   Wt 295 lb 6.4 oz (134 kg)   SpO2 97%   BMI 41.20 kg/m    Review of Systems He denies hypoglycemia.      Objective:   Physical Exam Foot exam is declined.  Lab Results  Component Value Date   HGBA1C 8.6 (A) 09/09/2019      Assessment & Plan:  Insulin-requiring type 2 DM, with PN: worse.   Patient Instructions  check your blood sugar 4 times a day.  vary the time of day when you check, between before the 3 meals, and at bedtime.  also check if you have symptoms of your blood sugar being too high or too low.  please keep a record of the readings and bring it to your next appointment here (or you can bring the meter itself).  You can write it on any piece of paper.  please call us sooner if your blood sugar goes below 70, or if you have a lot of readings over 200. Please take these settings:  basal rate of 10 units/hr, except for  5 units/hr, 20:00-07:00.  bolus of 1 unit/1.7 grams carbohydrate (except add 10 units to lunch and supper).   continue correction bolus (which some people call "sensitivity," or "insulin sensitivity ratio,"  or just "isr") of 1 unit for each 9 by which your glucose exceeds 100.   Suspend your pump for exercise.   Blood tests (for the pump) are requested for you today.  We'll let you know about the results.   For the continuous glucose monitor, they need 1 month of blood sugars, checked 4 times per day, so we can send to them.   Please come back for a follow-up appointment in 2 months.

## 2019-09-27 ENCOUNTER — Telehealth: Payer: Self-pay

## 2019-09-27 NOTE — Telephone Encounter (Signed)
Company: [employer]  Document: Written order for pump supplies Other records requested: None requested  All above requested information has been faxed successfully to [employer] listed above. Documents and fax confirmation have been placed in the faxed file for future reference.

## 2019-10-04 ENCOUNTER — Other Ambulatory Visit: Payer: Self-pay

## 2019-10-04 ENCOUNTER — Encounter: Payer: Medicare Other | Attending: Internal Medicine | Admitting: Dietician

## 2019-10-04 ENCOUNTER — Encounter: Payer: Self-pay | Admitting: Dietician

## 2019-10-04 DIAGNOSIS — E669 Obesity, unspecified: Secondary | ICD-10-CM

## 2019-10-04 DIAGNOSIS — E1165 Type 2 diabetes mellitus with hyperglycemia: Secondary | ICD-10-CM | POA: Diagnosis present

## 2019-10-04 DIAGNOSIS — Z794 Long term (current) use of insulin: Secondary | ICD-10-CM | POA: Diagnosis present

## 2019-10-04 NOTE — Patient Instructions (Addendum)
   Continue working on eating smaller portions at meals and chewing each bite of food thoroughly.   Avoid low blood sugar levels by being consistent with carbohydrate intake and not skipping meals. Have juice on hand to drink if it does go low again.

## 2019-10-04 NOTE — Progress Notes (Signed)
Supervised Weight Loss Visit Bariatric Nutrition Education  Planned Surgery: RYGB  Pt Expectation of Surgery/ Goals: to improve diabetes, high blood pressure and cholesterol, maintain weight loss  4th out of 6 SWL Appointments    NUTRITION ASSESSMENT  Anthropometrics   Start weight at [facility]: 289 lbs (date: 06/15/2019) Today's weight: 298 lbs BMI: 41.6 kg/m2    Lifestyle & Dietary Hx Patient states he is continuing to work on decreasing his portion sizes, and that this is difficult because he now lives alone and only needs to cook for himself. Typical meal pattern is 3 meals plus at least 2 snacks per day. Snacks include buttered popcorn and dark chocolate. States he has started swimming again and that he really enjoys this.  States he experiences a low blood sugar ~2x/month, typically passes out. States that taking 4 glucose tablets does not raise his blood sugar much or very quickly, and he asked what would be better to eat/drink if his blood sugar goes low again.   Current average weekly physical activity: swimming (6 days/week)   24-Hr Dietary Recall First Meal: bagel + peanut butter + orange + 1/2 banana Snack: - Second Meal: chicken salad sandwich Snack: granola bar Third Meal: pot roast + mashed potatoes + applesauce Snack: dark chocolate Beverages: water   NUTRITION DIAGNOSIS  Overweight/obesity ([place]-3.3) related to past poor dietary habits and physical inactivity as evidenced by patient w/ planned RYGB surgery following dietary guidelines for continued weight loss.   NUTRITION INTERVENTION  Nutrition counseling (C-1) and education (E-2) to facilitate bariatric surgery goals.  Pre-Op Goals Progress & New Goals . Working on incorporating non-starchy vegetables 2 times a day, 7 days a week. o Introduce variety into salads. . Monitoring carbohydrate intake for consistency. . Working on smaller portions at supper (eating off of smaller plates or serving smaller  amounts) . Practicing chewing food thoroughly.   . Swimming 6 days/week  . NEW: Avoid low blood sugar levels by being consistent with carbohydrate intake and not skipping meals. Have juice on hand to drink if it does go low again.   Learning Style & Readiness for Change Teaching method utilized: Visual & Auditory  Demonstrated degree of understanding via: Teach Back    MONITORING & EVALUATION Dietary intake, weekly physical activity, body weight, and pre-op goals in 1 month.   Next Steps  Patient is to return to [facility] in 1 month for 5th SWL.

## 2019-10-11 ENCOUNTER — Telehealth: Payer: Self-pay

## 2019-10-11 NOTE — Telephone Encounter (Signed)
Company: [employer]  Document: CMN for Therapeutic CGM Other records requested: None requested  All above requested information has been faxed successfully to [employer] listed above. Documents and fax confirmation have been placed in the faxed file for future reference.

## 2019-10-13 ENCOUNTER — Ambulatory Visit: Payer: Medicare Other | Attending: Internal Medicine

## 2019-10-13 ENCOUNTER — Ambulatory Visit: Payer: Medicare Other | Admitting: Psychology

## 2019-10-13 DIAGNOSIS — Z23 Encounter for immunization: Secondary | ICD-10-CM

## 2019-10-13 NOTE — Progress Notes (Signed)
   Covid-19 Vaccination Clinic  Name:  Jesse Guerrero    MRN: [id number] DOB: Dec 23, 1950  10/13/2019  [name] was observed post Covid-19 immunization for 15 minutes without incidence. He was provided with Vaccine Information Sheet and instruction to access the V-Safe system.   [name] was instructed to call 911 with any severe reactions post vaccine: Marland Kitchen Difficulty breathing  . Swelling of your face and throat  . A fast heartbeat  . A bad rash all over your body  . Dizziness and weakness    Immunizations Administered    Name Date Dose VIS Date Route   Pfizer COVID-19 Vaccine 10/13/2019  2:38 PM 0.3 mL 07/29/2019 Intramuscular   Manufacturer: [place]   Lot: [id number]   [place]: [id number]

## 2019-10-20 ENCOUNTER — Ambulatory Visit (INDEPENDENT_AMBULATORY_CARE_PROVIDER_SITE_OTHER): Payer: Medicare Other | Admitting: Psychology

## 2019-10-20 DIAGNOSIS — F509 Eating disorder, unspecified: Secondary | ICD-10-CM

## 2019-10-28 ENCOUNTER — Encounter: Payer: Medicare Other | Attending: Internal Medicine | Admitting: Dietician

## 2019-10-28 ENCOUNTER — Other Ambulatory Visit: Payer: Self-pay

## 2019-10-28 ENCOUNTER — Encounter: Payer: Self-pay | Admitting: Dietician

## 2019-10-28 DIAGNOSIS — E669 Obesity, unspecified: Secondary | ICD-10-CM

## 2019-10-28 DIAGNOSIS — Z794 Long term (current) use of insulin: Secondary | ICD-10-CM | POA: Insufficient documentation

## 2019-10-28 DIAGNOSIS — E1165 Type 2 diabetes mellitus with hyperglycemia: Secondary | ICD-10-CM | POA: Diagnosis present

## 2019-10-28 NOTE — Progress Notes (Signed)
Supervised Weight Loss Visit Bariatric Nutrition Education  Planned Surgery: RYGB  Pt Expectation of Surgery/ Goals: to improve diabetes, high blood pressure and cholesterol, maintain weight loss  5th out of 6 SWL Appointments    NUTRITION ASSESSMENT  Anthropometrics   Start weight at [facility]: 289 lbs (date: 06/15/2019) Today's weight: 301 lbs BMI: 41.9 kg/m2    Blood Glucose (pt reported) Fasting: 140-150s  2-Hr Post-Prandial: 220  Lifestyle & Dietary Hx Patient states he is continuing to work on decreasing his portion sizes, and that this is difficult because he now lives alone and only needs to cook for himself. Typical meal pattern is 3 meals plus at least 2 snacks per day.  Only had 1 low blood sugar this past month, which was previously a little more frequent. States it worries him when his BG gets too low and it "takes a while" after eating sugar for it to come back up.   Current average weekly physical activity: swimming (6 days/week)   24-Hr Dietary Recall First Meal: bagel + peanut butter + orange + 1/2 banana Snack: - Second Meal: chicken salad sandwich Snack: granola bar Third Meal: pot roast + mashed potatoes + applesauce Snack: dark chocolate Beverages: water, 1% milk   NUTRITION DIAGNOSIS  Overweight/obesity ([place]-3.3) related to past poor dietary habits and physical inactivity as evidenced by patient w/ planned RYGB surgery following dietary guidelines for continued weight loss.   NUTRITION INTERVENTION  Nutrition counseling (C-1) and education (E-2) to facilitate bariatric surgery goals.  Pre-Op Goals Progress & New Goals . Working on incorporating non-starchy vegetables 2 times a day, 7 days a week. o Introduce variety into salads. . Monitoring carbohydrate intake for consistency. . Working on smaller portions at supper (eating off of smaller plates or serving smaller amounts) . Practicing chewing food thoroughly.   . Swimming 6 days/week  . Working on  being consistent with carbohydrate intake and not skipping meals.  . NEW: Avoid drinking before/during/after meals  Learning Style & Readiness for Change Teaching method utilized: Visual & Auditory  Demonstrated degree of understanding via: Teach Back    MONITORING & EVALUATION Dietary intake, weekly physical activity, body weight, and pre-op goals in 1 month.   Next Steps  Patient is to return to [facility] in 1 month for 6th SWL.

## 2019-10-28 NOTE — Patient Instructions (Signed)
Continue drinking lots of water throughout the day, but practice NOT drinking 15 minutes before, during, and 30 minutes after each meal and snack.   Keep up swimming!   Do NOT skip meals... remember to always eat some carbohydrates with all meals to avoid low blood sugars.

## 2019-10-31 ENCOUNTER — Ambulatory Visit: Payer: Medicare Other | Admitting: Psychology

## 2019-10-31 ENCOUNTER — Ambulatory Visit (INDEPENDENT_AMBULATORY_CARE_PROVIDER_SITE_OTHER): Payer: Medicare Other | Admitting: Psychology

## 2019-10-31 DIAGNOSIS — F509 Eating disorder, unspecified: Secondary | ICD-10-CM

## 2019-11-04 ENCOUNTER — Encounter: Payer: Medicare Other | Attending: General Surgery | Admitting: Dietician

## 2019-11-04 ENCOUNTER — Other Ambulatory Visit: Payer: Self-pay

## 2019-11-04 DIAGNOSIS — Z794 Long term (current) use of insulin: Secondary | ICD-10-CM | POA: Insufficient documentation

## 2019-11-04 DIAGNOSIS — E7800 Pure hypercholesterolemia, unspecified: Secondary | ICD-10-CM | POA: Diagnosis not present

## 2019-11-04 DIAGNOSIS — E119 Type 2 diabetes mellitus without complications: Secondary | ICD-10-CM | POA: Insufficient documentation

## 2019-11-04 DIAGNOSIS — G4733 Obstructive sleep apnea (adult) (pediatric): Secondary | ICD-10-CM | POA: Diagnosis not present

## 2019-11-04 DIAGNOSIS — Z79899 Other long term (current) drug therapy: Secondary | ICD-10-CM | POA: Diagnosis not present

## 2019-11-04 DIAGNOSIS — Z6841 Body Mass Index (BMI) 40.0 and over, adult: Secondary | ICD-10-CM

## 2019-11-04 DIAGNOSIS — Z7982 Long term (current) use of aspirin: Secondary | ICD-10-CM | POA: Insufficient documentation

## 2019-11-04 DIAGNOSIS — K219 Gastro-esophageal reflux disease without esophagitis: Secondary | ICD-10-CM | POA: Insufficient documentation

## 2019-11-04 DIAGNOSIS — I10 Essential (primary) hypertension: Secondary | ICD-10-CM | POA: Diagnosis not present

## 2019-11-04 DIAGNOSIS — Z713 Dietary counseling and surveillance: Secondary | ICD-10-CM | POA: Insufficient documentation

## 2019-11-04 NOTE — Progress Notes (Signed)
Pre-Operative Nutrition Class:  Appt start time: 0900   End time:  1100.  Patient was seen on 11/04/19 for Pre-Operative Bariatric Surgery Education at Nutrition [facility] Diabetes Education [facility] at [facility].   Surgery date: TBD Surgery type: RYGB Start weight at [facility]: 289lbs Weight today: 305.8lbs with shoes  In[employer]  BODY COMP RESULTS deferred, patient unable to easily remove shoes and socks.                 Samples given per MNT protocol. Patient educated on appropriate usage: Celebrate Vitamins Multivitamin  Lot # [id number] ,  Exp: 10/2020;  Lot# [id number], Exp: 11/2020; [id number]  Exp: 06/2020; Lot# [id number], Exp: 06/2020  Celebrate Vitamins Calcium Citrate   Lot # [id number], Exp: 04/2020; Lot# [id number], Exp: 01/2020; Lot#: [id number], Exp: 02/2020; Lot# [id number], Exp: 03/2020; Lot# [id number], Exp: 02/2020; Lot# [id number], Exp: 02/2020; Lot# [id number], Exp: 06/2020  Renee Pain Protein Powder   Lot # [id number], Exp: 01/2020; Lot#: [id number], Exp: 01/2020; Lot#: [id number], Exp: 01/2020  Premier Protein Shake   Lot# [id number], Exp: 05/26/20   The following the learning objectives were met by the patient during this course:  Identify Pre-Op Dietary Goals and will begin 2 weeks pre-operatively  Identify appropriate sources of fluids and proteins   State protein recommendations and appropriate sources pre and post-operatively  Identify Post-Operative Dietary Goals and will follow for 2 weeks post-operatively  Identify appropriate multivitamin and calcium sources  Describe the need for physical activity post-operatively and will follow MD recommendations  State when to call healthcare provider regarding medication questions or post-operative complications  Handouts given during class include:  Pre-Op Bariatric Surgery Diet Handout  Protein Shake Handout  Post-Op Bariatric Surgery Nutrition Handout  BELT Program Information Flyer  Support Group Information Flyer  [facility] Bariatric Supplements Price List  Follow-Up  Plan: Patient will follow-up at [place], at about 2 weeks post operatively for diet advancement per MD.

## 2019-11-08 ENCOUNTER — Ambulatory Visit: Payer: Medicare Other | Attending: Internal Medicine

## 2019-11-08 DIAGNOSIS — Z23 Encounter for immunization: Secondary | ICD-10-CM

## 2019-11-08 NOTE — Progress Notes (Signed)
   Covid-19 Vaccination Clinic  Name:  Jesse Guerrero    MRN: [id number] DOB: 02/24/1951  11/08/2019  [name] was observed post Covid-19 immunization for 15 minutes without incident. He was provided with Vaccine Information Sheet and instruction to access the V-Safe system.   [name] was instructed to call 911 with any severe reactions post vaccine: Marland Kitchen Difficulty breathing  . Swelling of face and throat  . A fast heartbeat  . A bad rash all over body  . Dizziness and weakness   Immunizations Administered    Name Date Dose VIS Date Route   Pfizer COVID-19 Vaccine 11/08/2019  9:20 AM 0.3 mL 07/29/2019 Intramuscular   Manufacturer: [place]   Lot: [id number]   [place]: [id number]

## 2019-11-21 ENCOUNTER — Ambulatory Visit: Payer: Medicare Other | Admitting: Psychology

## 2019-11-22 NOTE — Progress Notes (Signed)
Please place surgery orders. Pt scheduled for his PST appt tomorrow.

## 2019-11-22 NOTE — Patient Instructions (Addendum)
DUE TO COVID-19 ONLY ONE VISITOR IS ALLOWED TO COME WITH YOU AND STAY IN THE WAITING ROOM ONLY DURING PRE OP AND PROCEDURE DAY OF SURGERY. THE 1 VISITOR MAY VISIT WITH YOU AFTER SURGERY IN YOUR PRIVATE ROOM DURING VISITING HOURS ONLY!  YOU NEED TO HAVE A COVID 19 TEST ON 11-24-19 @10 :00 AM, THIS TEST MUST BE DONE BEFORE SURGERY, COME  [place], Jesse Guerrero , [id number].  (Jesse Guerrero) ONCE YOUR COVID TEST IS COMPLETED, PLEASE BEGIN THE QUARANTINE INSTRUCTIONS AS OUTLINED IN YOUR HANDOUT.                Jesse Guerrero  11/22/2019   Your procedure is scheduled on: 11-28-19    Report to [facility] Main  Entrance    Report to Admitting at 5:30  AM     Call this number if you have problems the morning of surgery [phone]    Remember: Do not eat food or drink liquids :After Midnight.    Take these medicines the morning of surgery with A SIP OF WATER: Gabapentin (Neurontin), prn and Hydralazine (Apresoline), prn, and Metoprolol  BRUSH YOUR TEETH MORNING OF SURGERY AND RINSE YOUR MOUTH OUT, NO CHEWING GUM CANDY OR MINTS.   DO NOT TAKE ANY DIABETIC MEDICATIONS DAY OF YOUR SURGERY                               You may not have any metal on your body including hair pins and              piercings     Do not wear cologne, make-up, lotions, powders or perfumes, deodorant              Men may shave face and neck.   Do not bring valuables to the hospital. Jesse Guerrero.  Contacts, dentures or bridgework may not be worn into surgery.  You may bring an overnight bag    Special Instructions: N/A              Please read over the following fact sheets you were given: _____________________________________________________________________ How to Manage Your Diabetes Before and After Surgery  Why is it important to control my blood sugar before and after surgery? . Improving blood sugar levels before and after surgery  helps healing and can limit problems. . A way of improving blood sugar control is eating a healthy diet by: o  Eating less sugar and carbohydrates o  Increasing activity/exercise o  Talking with your doctor about reaching your blood sugar goals . High blood sugars (greater than 180 mg/dL) can raise your risk of infections and slow your recovery, so you will need to focus on controlling your diabetes during the weeks before surgery. . Make sure that the doctor who takes care of your diabetes knows about your planned surgery including the date and location.  How do I manage my blood sugar before surgery? . Check your blood sugar at least 4 times a day, starting 2 days before surgery, to make sure that the level is not too high or low. o Check your blood sugar the morning of your surgery when you wake up and every 2 hours until you get to the Short Stay unit. . If your blood sugar is less than 70 mg/dL, you will  need to treat for low blood sugar: o Do not take insulin. o Treat a low blood sugar (less than 70 mg/dL) with  cup of clear juice (cranberry or apple), 4 glucose tablets, OR glucose gel. o Recheck blood sugar in 15 minutes after treatment (to make sure it is greater than 70 mg/dL). If your blood sugar is not greater than 70 mg/dL on recheck, call [phone] for further instructions. . Report your blood sugar to the short stay nurse when you get to Short Stay.  . If you are admitted to the hospital after surgery: o Your blood sugar will be checked by the staff and you will probably be given insulin after surgery (instead of oral diabetes medicines) to make sure you have good blood sugar levels. o The goal for blood sugar control after surgery is 80-180 mg/dL.   WHAT DO I DO ABOUT MY DIABETES MEDICATION?  Marland Kitchen Do not take oral diabetes medicines (pills) the morning of surgery.  For patients with insulin pumps: Contact your diabetes doctor for specific instructions before  surgery. Decrease basal rates by 20% at midnight the night before your surgery. Note that if your surgery is planned to be longer than 2 hours, your insulin pump will be removed and intravenous (IV) insulin will be started and managed by the nurses and the anesthesiologist. You will be able to restart your insulin pump once you are awake and able to manage it.  Make sure to bring insulin pump supplies to the hospital with you in case the  site needs to be changed.  Patient Signature:  Date:   Nurse Signature:  Date:   Reviewed and Endorsed by [facility] Patient Education Committee, August 2015            [facility] - Preparing for Surgery Before surgery, you can play an important role.  Because skin is not sterile, your skin needs to be as free of germs as possible.  You can reduce the number of germs on your skin by washing with CHG (chlorahexidine gluconate) soap before surgery.  CHG is an antiseptic cleaner which kills germs and bonds with the skin to continue killing germs even after washing. Please DO NOT use if you have an allergy to CHG or antibacterial soaps.  If your skin becomes reddened/irritated stop using the CHG and inform your nurse when you arrive at Short Stay. Do not shave (including legs and underarms) for at least 48 hours prior to the first CHG shower.  You may shave your face/neck. Please follow these instructions carefully:  1.  Shower with CHG Soap the night before surgery and the  morning of Surgery.  2.  If you choose to wash your hair, wash your hair first as usual with your  normal  shampoo.  3.  After you shampoo, rinse your hair and body thoroughly to remove the  shampoo.                           4.  Use CHG as you would any other liquid soap.  You can apply chg directly  to the skin and wash                       Gently with a scrungie or clean washcloth.  5.  Apply the CHG Soap to your body ONLY FROM THE NECK DOWN.   Do not use on face/ open  Wound or open sores. Avoid contact with eyes, ears mouth and genitals (private parts).                       Wash face,  Genitals (private parts) with your normal soap.             6.  Wash thoroughly, paying special attention to the area where your surgery  will be performed.  7.  Thoroughly rinse your body with warm water from the neck down.  8.  DO NOT shower/wash with your normal soap after using and rinsing off  the CHG Soap.                9.  Pat yourself dry with a clean towel.            10.  Wear clean pajamas.            11.  Place clean sheets on your bed the night of your first shower and do not  sleep with pets. Day of Surgery : Do not apply any lotions/deodorants the morning of surgery.  Please wear clean clothes to the hospital/surgery center.  FAILURE TO FOLLOW THESE INSTRUCTIONS MAY RESULT IN THE CANCELLATION OF YOUR SURGERY PATIENT SIGNATURE_________________________________  NURSE SIGNATURE__________________________________  ________________________________________________________________________ PAIN IS EXPECTED AFTER SURGERY AND WILL NOT BE COMPLETELY ELIMINATED. AMBULATION AND TYLENOL WILL HELP REDUCE INCISIONAL AND GAS PAIN. MOVEMENT IS KEY!  YOU ARE EXPECTED TO BE OUT OF BED WITHIN 4 HOURS OF ADMISSION TO YOUR PATIENT ROOM.  SITTING IN THE RECLINER THROUGHOUT THE DAY IS IMPORTANT FOR DRINKING FLUIDS AND MOVING GAS THROUGHOUT THE GI TRACT.  COMPRESSION STOCKINGS SHOULD BE WORN [place] UNLESS YOU ARE WALKING.   INCENTIVE SPIROMETER SHOULD BE USED EVERY HOUR WHILE AWAKE TO DECREASE POST-OPERATIVE COMPLICATIONS SUCH AS PNEUMONIA.  WHEN DISCHARGED HOME, IT IS IMPORTANT TO CONTINUE TO WALK EVERY HOUR AND USE THE INCENTIVE SPIROMETER EVERY HOUR.

## 2019-11-22 NOTE — Progress Notes (Addendum)
PCP - Dwain Sarna, MD Cardiologist -   Chest x-ray - 06-07-19 EKG - 06-03-19 Stress Test - 07-18-19 ECHO -  Cardiac Cath -   Sleep Study -  CPAP -   Fasting Blood Sugar -  Checks Blood Sugar _____ times a day  Blood Thinner Instructions: Aspirin Instructions: 81 mg. Last dose 11-22-19, Pt plan to hold 5 days prior to surgery. Last Dose:  Anesthesia review: Hx of DM, HTN, Sleep Apnea, Insulin Pump  Patient denies shortness of breath, fever, cough and chest pain at PAT appointment   Patient verbalized understanding of instructions that were given to them at the PAT appointment. Patient was also instructed that they will need to review over the PAT instructions again at home before surgery.

## 2019-11-23 ENCOUNTER — Encounter (HOSPITAL_COMMUNITY): Payer: Self-pay

## 2019-11-23 ENCOUNTER — Encounter (HOSPITAL_COMMUNITY)
Admission: RE | Admit: 2019-11-23 | Discharge: 2019-11-23 | Disposition: A | Discharge status: Home / Self Care | Payer: Medicare Other | Source: Ambulatory Visit | Attending: General Surgery | Admitting: General Surgery

## 2019-11-23 ENCOUNTER — Ambulatory Visit: Payer: Medicare Other | Admitting: Dietician

## 2019-11-23 ENCOUNTER — Other Ambulatory Visit: Payer: Self-pay

## 2019-11-23 HISTORY — DX: Other complications of anesthesia, initial encounter: T88.59XA

## 2019-11-24 ENCOUNTER — Other Ambulatory Visit (HOSPITAL_COMMUNITY)
Admission: RE | Admit: 2019-11-24 | Discharge: 2019-11-24 | Disposition: A | Discharge status: Home / Self Care | Payer: Medicare Other | Source: Ambulatory Visit | Attending: General Surgery | Admitting: General Surgery

## 2019-11-24 ENCOUNTER — Encounter (HOSPITAL_COMMUNITY)
Admission: RE | Admit: 2019-11-24 | Discharge: 2019-11-24 | Disposition: A | Discharge status: Home / Self Care | Payer: Medicare Other | Source: Ambulatory Visit | Attending: General Surgery | Admitting: General Surgery

## 2019-11-24 DIAGNOSIS — Z20822 Contact with and (suspected) exposure to covid-19: Secondary | ICD-10-CM | POA: Insufficient documentation

## 2019-11-24 DIAGNOSIS — Z01812 Encounter for preprocedural laboratory examination: Secondary | ICD-10-CM | POA: Insufficient documentation

## 2019-11-24 DIAGNOSIS — E119 Type 2 diabetes mellitus without complications: Secondary | ICD-10-CM | POA: Insufficient documentation

## 2019-11-24 LAB — CBC
HCT: 46.8 % (ref 39.0–52.0)
Hemoglobin: 15 g/dL (ref 13.0–17.0)
MCH: 27.9 pg (ref 26.0–34.0)
MCHC: 32.1 g/dL (ref 30.0–36.0)
MCV: 87 fL (ref 80.0–100.0)
Platelets: 126 10*3/uL — ABNORMAL LOW (ref 150–400)
RBC: 5.38 MIL/uL (ref 4.22–5.81)
RDW: 14 % (ref 11.5–15.5)
WBC: 8.6 10*3/uL (ref 4.0–10.5)
nRBC: 0 % (ref 0.0–0.2)

## 2019-11-24 LAB — BASIC METABOLIC PANEL
Anion gap: 5 (ref 5–15)
BUN: 13 mg/dL (ref 8–23)
CO2: 25 mmol/L (ref 22–32)
Calcium: 8.7 mg/dL — ABNORMAL LOW (ref 8.9–10.3)
Chloride: 106 mmol/L (ref 98–111)
Creatinine, Ser: 0.74 mg/dL (ref 0.61–1.24)
GFR calc Af Amer: >60 mL/min (ref >60)
GFR calc non Af Amer: >60 mL/min (ref >60)
Glucose, Bld: 221 mg/dL — ABNORMAL HIGH (ref 70–99)
Potassium: 4.1 mmol/L (ref 3.5–5.1)
Sodium: 136 mmol/L (ref 135–145)

## 2019-11-24 LAB — GLUCOSE, CAPILLARY: Glucose-Capillary: 194 mg/dL — ABNORMAL HIGH (ref 70–99)

## 2019-11-24 LAB — SARS CORONAVIRUS 2 (TAT 6-24 HRS): SARS Coronavirus 2: NEGATIVE

## 2019-11-25 LAB — HEMOGLOBIN A1C
Hgb A1c MFr Bld: 8.7 % — ABNORMAL HIGH (ref 4.8–5.6)
Mean Plasma Glucose: 203 mg/dL

## 2019-11-25 NOTE — Progress Notes (Signed)
Anesthesia Chart Review   Case: [id number] Date/Time: 11/28/19 0700   Procedure: LAPAROSCOPIC ROUX-EN-Y GASTRIC BYPASS WITH UPPER ENDOSCOPY, ERAS Pathway (N/A )   Anesthesia type: General   Pre-op diagnosis: Morbid Obesity, OSA, DM II, GERD   Location: [facility] ROOM 02 / [facility]   Surgeons: Kinsinger, Arta Bruce, MD      DISCUSSION:69 y.o. former smoker with h/o HTN, GERD, Hepatitis C, DM II, sleep apnea, morbid obesity scheduled for above procedure 11/28/19 with Dr. Gurney Maxin.   Seen by cardiologist, Dr. Quay Burow, 06/03/2019 for preoperative clearance.  Per OV note, "Mr.Delehantty comes today for preoperative clearance before gastric bypass surgery.  He does have multiple cardiac risk factors including family history with a father who had a stent, remote tobacco abuse having quit 20 years ago but smoked 30 to 50 pack years, treated hypertension, diabetes and hyperlipidemia.  He is very active, swims a mile a day 6 days a week without symptoms.  I am going to get a pharmacologic Myoview stress test to risk stratify him."   Stress test 06/22/2019 Low risk study.    Anticipate pt can proceed with planned procedure barring acute status change.   VS: BP (!) 150/67   Pulse (!) 54   Temp 36.7 C (Oral)   Resp 18   Ht 5\' 11"  (1.803 m)   Wt (!) 137.9 kg   SpO2 98%   BMI 42.40 kg/m   PROVIDERS: Dwain Sarna, MD is PCP   Quay Burow, MD is Cardiologist  LABS: Labs reviewed: Acceptable for surgery. (all labs ordered are listed, but only abnormal results are displayed)  Labs Reviewed  BASIC METABOLIC PANEL - Abnormal; Notable for the following components:      Result Value   Glucose, Bld 221 (*)    Calcium 8.7 (*)    All other components within normal limits  CBC - Abnormal; Notable for the following components:   Platelets 126 (*)    All other components within normal limits  HEMOGLOBIN A1C - Abnormal; Notable for the following components:   Hgb A1c MFr Bld 8.7 (*)    All other  components within normal limits  GLUCOSE, CAPILLARY - Abnormal; Notable for the following components:   Glucose-Capillary 194 (*)    All other components within normal limits     IMAGES:   EKG: 06/07/2019 Rate 57 bpm  Sinus bradycardia Left axis deviation Cannot rule out Anterior infarct , age undetermined Abnormal ECG  CV: Myocardial Perfusion 06/22/2019  Nuclear stress EF: 53%. No wall motion abnormalities  The left ventricular ejection fraction is mildly decreased (45-54%).  There was no ST segment deviation noted during stress.  Defect 1: There is a small defect of mild severity present in the apical lateral and apex location. Fixed defect.  This is a low risk study. With no ischemia identified Past Medical History:  Diagnosis Date  . Asthma    as child  . Complication of anesthesia    30 years ago, "awoke prematurely" during dental procedure  . Diabetes mellitus without complication ([place])   . GERD (gastroesophageal reflux disease)   . Hepatitis C   . High cholesterol   . Hypertension   . Sleep apnea    wears cpap     Past Surgical History:  Procedure Laterality Date  . BACK SURGERY    . CHOLECYSTECTOMY    . COLONOSCOPY      x 3 per pt - all normal - last colon 10 yrs ago  either in [place] or [place]  . thumb surgery Left     MEDICATIONS: . amLODipine (NORVASC) 5 MG tablet  . aspirin EC 81 MG tablet  . atorvastatin (LIPITOR) 40 MG tablet  . Continuous Blood Gluc Sensor (DEXCOM G6 SENSOR) MISC  . esomeprazole (NEXIUM) 20 MG capsule  . gabapentin (NEURONTIN) 300 MG capsule  . hydrALAZINE (APRESOLINE) 25 MG tablet  . Insulin Infusion Pump Supplies (PARADIGM RESERVOIR 3ML) MISC  . Insulin Infusion Pump Supplies (QUICK-SET INFUSION 43" 9MM) MISC  . lisinopril (PRINIVIL,ZESTRIL) 30 MG tablet  . lisinopril (ZESTRIL) 40 MG tablet  . metoprolol tartrate (LOPRESSOR) 25 MG tablet  . NOVOLIN R RELION 100 UNIT/ML injection   No current  facility-administered medications for this encounter.     Maia Plan [facility] Pre-Surgical Testing [phone] 11/25/19  10:06 AM

## 2019-11-27 NOTE — Anesthesia Preprocedure Evaluation (Addendum)
Anesthesia Evaluation  Patient identified by MRN, date of birth, ID band Patient awake    Reviewed: Allergy & Precautions, NPO status , Patient's Chart, lab work & pertinent test results  Airway Mallampati: III  TM Distance: >3 FB Neck ROM: Full    Dental  (+) Dental Advisory Given, Partial Upper, Partial Lower   Pulmonary asthma , sleep apnea and Continuous Positive Airway Pressure Ventilation , former smoker,    Pulmonary exam normal breath sounds clear to auscultation       Cardiovascular hypertension, Pt. on medications and Pt. on home beta blockers (-) angina(-) Past MI and (-) Cardiac Stents Normal cardiovascular exam Rhythm:Regular Rate:Normal     Neuro/Psych negative neurological ROS  negative psych ROS   GI/Hepatic GERD  Medicated,(+) Hepatitis -, C  Endo/Other  diabetes (insulin pump), Type 2, Insulin DependentMorbid obesity  Renal/GU negative Renal ROS     Musculoskeletal negative musculoskeletal ROS (+)   Abdominal   Peds  Hematology negative hematology ROS (+)   Anesthesia Other Findings Day of surgery medications reviewed with the patient.  Reproductive/Obstetrics                            Anesthesia Physical Anesthesia Plan  ASA: III  Anesthesia Plan: General   Post-op Pain Management:    Induction: Intravenous  PONV Risk Score and Plan: 3 and Midazolam, Dexamethasone and Ondansetron  Airway Management Planned: Oral ETT  Additional Equipment:   Intra-op Plan:   Post-operative Plan: Extubation in OR  Informed Consent: I have reviewed the patients History and Physical, chart, labs and discussed the procedure including the risks, benefits and alternatives for the proposed anesthesia with the patient or authorized representative who has indicated his/her understanding and acceptance.     Dental advisory given  Plan Discussed with: CRNA  Anesthesia Plan  Comments: (Patient removed insulin pump pre-operatively.)       Anesthesia Quick Evaluation

## 2019-11-28 ENCOUNTER — Ambulatory Visit: Payer: 59

## 2019-11-28 ENCOUNTER — Inpatient Hospital Stay (HOSPITAL_COMMUNITY)
Admission: RE | Admit: 2019-11-28 | Discharge: 2019-11-29 | DRG: 621 | Disposition: A | Discharge status: Home / Self Care | Payer: Medicare Other | Attending: General Surgery | Admitting: General Surgery

## 2019-11-28 ENCOUNTER — Encounter (HOSPITAL_COMMUNITY)
Admission: RE | Disposition: A | Discharge status: Home / Self Care | Payer: Self-pay | Source: Home / Self Care | Attending: General Surgery

## 2019-11-28 ENCOUNTER — Encounter (HOSPITAL_COMMUNITY): Payer: Self-pay | Admitting: General Surgery

## 2019-11-28 ENCOUNTER — Other Ambulatory Visit: Payer: Self-pay

## 2019-11-28 ENCOUNTER — Inpatient Hospital Stay (HOSPITAL_COMMUNITY): Payer: Medicare Other | Admitting: Physician Assistant

## 2019-11-28 ENCOUNTER — Inpatient Hospital Stay (HOSPITAL_COMMUNITY): Payer: Medicare Other | Admitting: Anesthesiology

## 2019-11-28 DIAGNOSIS — E7800 Pure hypercholesterolemia, unspecified: Secondary | ICD-10-CM | POA: Diagnosis present

## 2019-11-28 DIAGNOSIS — Z8619 Personal history of other infectious and parasitic diseases: Secondary | ICD-10-CM | POA: Diagnosis not present

## 2019-11-28 DIAGNOSIS — Z9641 Presence of insulin pump (external) (internal): Secondary | ICD-10-CM | POA: Diagnosis present

## 2019-11-28 DIAGNOSIS — Z6841 Body Mass Index (BMI) 40.0 and over, adult: Secondary | ICD-10-CM

## 2019-11-28 DIAGNOSIS — J45909 Unspecified asthma, uncomplicated: Secondary | ICD-10-CM | POA: Diagnosis present

## 2019-11-28 DIAGNOSIS — Z7982 Long term (current) use of aspirin: Secondary | ICD-10-CM

## 2019-11-28 DIAGNOSIS — Z79899 Other long term (current) drug therapy: Secondary | ICD-10-CM

## 2019-11-28 DIAGNOSIS — K769 Liver disease, unspecified: Secondary | ICD-10-CM | POA: Diagnosis present

## 2019-11-28 DIAGNOSIS — G4733 Obstructive sleep apnea (adult) (pediatric): Secondary | ICD-10-CM | POA: Diagnosis present

## 2019-11-28 DIAGNOSIS — Z87891 Personal history of nicotine dependence: Secondary | ICD-10-CM | POA: Diagnosis not present

## 2019-11-28 DIAGNOSIS — E1165 Type 2 diabetes mellitus with hyperglycemia: Secondary | ICD-10-CM | POA: Diagnosis present

## 2019-11-28 DIAGNOSIS — E785 Hyperlipidemia, unspecified: Secondary | ICD-10-CM | POA: Diagnosis present

## 2019-11-28 DIAGNOSIS — I10 Essential (primary) hypertension: Secondary | ICD-10-CM | POA: Diagnosis present

## 2019-11-28 DIAGNOSIS — K219 Gastro-esophageal reflux disease without esophagitis: Secondary | ICD-10-CM | POA: Diagnosis present

## 2019-11-28 DIAGNOSIS — Z833 Family history of diabetes mellitus: Secondary | ICD-10-CM

## 2019-11-28 HISTORY — PX: GASTRIC ROUX-EN-Y: SHX5262

## 2019-11-28 LAB — GLUCOSE, CAPILLARY
Glucose-Capillary: 182 mg/dL — ABNORMAL HIGH (ref 70–99)
Glucose-Capillary: 197 mg/dL — ABNORMAL HIGH (ref 70–99)
Glucose-Capillary: 202 mg/dL — ABNORMAL HIGH (ref 70–99)
Glucose-Capillary: 208 mg/dL — ABNORMAL HIGH (ref 70–99)
Glucose-Capillary: 271 mg/dL — ABNORMAL HIGH (ref 70–99)
Glucose-Capillary: 271 mg/dL — ABNORMAL HIGH (ref 70–99)
Glucose-Capillary: 321 mg/dL — ABNORMAL HIGH (ref 70–99)
Glucose-Capillary: 344 mg/dL — ABNORMAL HIGH (ref 70–99)

## 2019-11-28 LAB — HEMOGLOBIN AND HEMATOCRIT, BLOOD
HCT: 49.4 % (ref 39.0–52.0)
Hemoglobin: 16.2 g/dL (ref 13.0–17.0)

## 2019-11-28 SURGERY — LAPAROSCOPIC ROUX-EN-Y GASTRIC BYPASS WITH UPPER ENDOSCOPY
Anesthesia: General

## 2019-11-28 MED ORDER — AMLODIPINE BESYLATE 5 MG PO TABS
5.0000 mg | ORAL_TABLET | Freq: Every day | ORAL | Status: DC
  Administered 2019-11-28: 5 mg via ORAL
  Filled 2019-11-28: qty 1

## 2019-11-28 MED ORDER — MIDAZOLAM HCL 2 MG/2ML IJ SOLN
INTRAMUSCULAR | Status: AC
  Filled 2019-11-28: qty 2

## 2019-11-28 MED ORDER — ENOXAPARIN (LOVENOX) PATIENT EDUCATION KIT
PACK | Freq: Once | Status: AC
  Filled 2019-11-28: qty 1

## 2019-11-28 MED ORDER — PHENYLEPHRINE HCL-NACL 10-0.9 MG/250ML-% IV SOLN
INTRAVENOUS | Status: DC | PRN
  Administered 2019-11-28: 50 ug/min via INTRAVENOUS

## 2019-11-28 MED ORDER — ONDANSETRON HCL 4 MG/2ML IJ SOLN
4.0000 mg | INTRAMUSCULAR | Status: DC | PRN
  Administered 2019-11-28: 4 mg via INTRAVENOUS
  Filled 2019-11-28: qty 2

## 2019-11-28 MED ORDER — MIDAZOLAM HCL 5 MG/5ML IJ SOLN
INTRAMUSCULAR | Status: DC | PRN
  Administered 2019-11-28: 2 mg via INTRAVENOUS

## 2019-11-28 MED ORDER — PHENYLEPHRINE 40 MCG/ML (10ML) SYRINGE FOR IV PUSH (FOR BLOOD PRESSURE SUPPORT)
PREFILLED_SYRINGE | INTRAVENOUS | Status: AC
  Filled 2019-11-28: qty 10

## 2019-11-28 MED ORDER — DEXTROSE-NACL 5-0.45 % IV SOLN: INTRAVENOUS | Status: DC

## 2019-11-28 MED ORDER — LIDOCAINE 2% (20 MG/ML) 5 ML SYRINGE
INTRAMUSCULAR | Status: AC
  Filled 2019-11-28: qty 5

## 2019-11-28 MED ORDER — ROCURONIUM BROMIDE 10 MG/ML (PF) SYRINGE
PREFILLED_SYRINGE | INTRAVENOUS | Status: DC | PRN
  Administered 2019-11-28: 20 mg via INTRAVENOUS
  Administered 2019-11-28: 70 mg via INTRAVENOUS

## 2019-11-28 MED ORDER — PHENYLEPHRINE HCL (PRESSORS) 10 MG/ML IV SOLN
INTRAVENOUS | Status: AC
  Filled 2019-11-28: qty 1

## 2019-11-28 MED ORDER — MORPHINE SULFATE (PF) 2 MG/ML IV SOLN
1.0000 mg | INTRAVENOUS | Status: DC | PRN
  Administered 2019-11-28: 2 mg via INTRAVENOUS
  Filled 2019-11-28: qty 1

## 2019-11-28 MED ORDER — ACETAMINOPHEN 500 MG PO TABS
ORAL_TABLET | ORAL | Status: AC
  Administered 2019-11-28: 06:00:00 1000 mg via ORAL
  Filled 2019-11-28: qty 2

## 2019-11-28 MED ORDER — GLYCOPYRROLATE PF 0.2 MG/ML IJ SOSY
PREFILLED_SYRINGE | INTRAMUSCULAR | Status: DC | PRN
  Administered 2019-11-28: .2 mg via INTRAVENOUS

## 2019-11-28 MED ORDER — EPHEDRINE 5 MG/ML INJ
INTRAVENOUS | Status: AC
  Filled 2019-11-28: qty 10

## 2019-11-28 MED ORDER — SODIUM CHLORIDE 0.9 % IV SOLN
2.0000 g | Freq: Once | INTRAVENOUS | Status: AC
  Administered 2019-11-28: 2 g via INTRAVENOUS

## 2019-11-28 MED ORDER — INSULIN ASPART 100 UNIT/ML ~~LOC~~ SOLN
SUBCUTANEOUS | Status: AC
  Filled 2019-11-28: qty 1

## 2019-11-28 MED ORDER — FENTANYL CITRATE (PF) 100 MCG/2ML IJ SOLN
INTRAMUSCULAR | Status: DC | PRN
  Administered 2019-11-28: 100 ug via INTRAVENOUS
  Administered 2019-11-28 (×2): 50 ug via INTRAVENOUS

## 2019-11-28 MED ORDER — ONDANSETRON HCL 4 MG/2ML IJ SOLN: 4.0000 mg | Freq: Once | INTRAMUSCULAR | Status: DC | PRN

## 2019-11-28 MED ORDER — ROCURONIUM BROMIDE 10 MG/ML (PF) SYRINGE
PREFILLED_SYRINGE | INTRAVENOUS | Status: AC
  Filled 2019-11-28: qty 10

## 2019-11-28 MED ORDER — FAMOTIDINE IN NACL 20-0.9 MG/50ML-% IV SOLN
20.0000 mg | Freq: Two times a day (BID) | INTRAVENOUS | Status: DC
  Administered 2019-11-28 – 2019-11-29 (×2): 20 mg via INTRAVENOUS
  Filled 2019-11-28 (×2): qty 50

## 2019-11-28 MED ORDER — BUPIVACAINE LIPOSOME 1.3 % IJ SUSP
20.0000 mL | Freq: Once | INTRAMUSCULAR | Status: DC
  Filled 2019-11-28 (×2): qty 20

## 2019-11-28 MED ORDER — LIDOCAINE HCL 2 % IJ SOLN
INTRAMUSCULAR | Status: AC
  Filled 2019-11-28: qty 20

## 2019-11-28 MED ORDER — GLYCOPYRROLATE PF 0.2 MG/ML IJ SOSY
PREFILLED_SYRINGE | INTRAMUSCULAR | Status: AC
  Filled 2019-11-28: qty 1

## 2019-11-28 MED ORDER — BUPIVACAINE LIPOSOME 1.3 % IJ SUSP
INTRAMUSCULAR | Status: DC | PRN
  Administered 2019-11-28: 20 mL

## 2019-11-28 MED ORDER — SIMETHICONE 80 MG PO CHEW
80.0000 mg | CHEWABLE_TABLET | Freq: Four times a day (QID) | ORAL | Status: DC | PRN
  Administered 2019-11-29: 80 mg via ORAL
  Filled 2019-11-28: qty 1

## 2019-11-28 MED ORDER — SUGAMMADEX SODIUM 200 MG/2ML IV SOLN
INTRAVENOUS | Status: DC | PRN
  Administered 2019-11-28: 400 mg via INTRAVENOUS

## 2019-11-28 MED ORDER — INSULIN ASPART 100 UNIT/ML ~~LOC~~ SOLN
0.0000 [IU] | SUBCUTANEOUS | Status: DC
  Administered 2019-11-28: 8 [IU] via SUBCUTANEOUS
  Administered 2019-11-28: 11 [IU] via SUBCUTANEOUS

## 2019-11-28 MED ORDER — SODIUM CHLORIDE 0.9 % IV SOLN
INTRAVENOUS | Status: AC
  Filled 2019-11-28: qty 2

## 2019-11-28 MED ORDER — INSULIN ASPART 100 UNIT/ML ~~LOC~~ SOLN
5.0000 [IU] | Freq: Once | SUBCUTANEOUS | Status: AC
  Administered 2019-11-28: 5 [IU] via SUBCUTANEOUS

## 2019-11-28 MED ORDER — BUPIVACAINE HCL 0.25 % IJ SOLN
INTRAMUSCULAR | Status: AC
  Filled 2019-11-28: qty 1

## 2019-11-28 MED ORDER — HYDRALAZINE HCL 20 MG/ML IJ SOLN
INTRAMUSCULAR | Status: AC
  Filled 2019-11-28: qty 1

## 2019-11-28 MED ORDER — DEXAMETHASONE SODIUM PHOSPHATE 4 MG/ML IJ SOLN
INTRAMUSCULAR | Status: DC | PRN
  Administered 2019-11-28: 10 mg via INTRAVENOUS

## 2019-11-28 MED ORDER — FENTANYL CITRATE (PF) 100 MCG/2ML IJ SOLN
INTRAMUSCULAR | Status: AC
  Filled 2019-11-28: qty 2

## 2019-11-28 MED ORDER — LACTATED RINGERS IR SOLN
Status: DC | PRN
  Administered 2019-11-28: 1000 mL

## 2019-11-28 MED ORDER — LIDOCAINE 2% (20 MG/ML) 5 ML SYRINGE
INTRAMUSCULAR | Status: DC | PRN
  Administered 2019-11-28: 60 mg via INTRAVENOUS
  Administered 2019-11-28: 1 mg/kg/h via INTRAVENOUS

## 2019-11-28 MED ORDER — ACETAMINOPHEN 160 MG/5ML PO SOLN
1000.0000 mg | Freq: Three times a day (TID) | ORAL | Status: DC
  Administered 2019-11-28: 15:00:00 1000 mg via ORAL
  Filled 2019-11-28 (×3): qty 40.6

## 2019-11-28 MED ORDER — STERILE WATER FOR IRRIGATION IR SOLN
Status: DC | PRN
  Administered 2019-11-28: 1000 mL

## 2019-11-28 MED ORDER — OXYCODONE HCL 5 MG/5ML PO SOLN: 5.0000 mg | Freq: Four times a day (QID) | ORAL | Status: DC | PRN

## 2019-11-28 MED ORDER — METOPROLOL TARTRATE 5 MG/5ML IV SOLN: 5.0000 mg | Freq: Four times a day (QID) | INTRAVENOUS | Status: DC | PRN

## 2019-11-28 MED ORDER — PROPOFOL 10 MG/ML IV BOLUS
INTRAVENOUS | Status: DC | PRN
  Administered 2019-11-28: 180 mg via INTRAVENOUS

## 2019-11-28 MED ORDER — PROPOFOL 10 MG/ML IV BOLUS
INTRAVENOUS | Status: AC
  Filled 2019-11-28: qty 20

## 2019-11-28 MED ORDER — ACETAMINOPHEN 500 MG PO TABS: 1000.0000 mg | ORAL_TABLET | Freq: Three times a day (TID) | ORAL | Status: DC

## 2019-11-28 MED ORDER — LACTATED RINGERS IV SOLN: INTRAVENOUS | Status: DC

## 2019-11-28 MED ORDER — EPHEDRINE SULFATE-NACL 50-0.9 MG/10ML-% IV SOSY
PREFILLED_SYRINGE | INTRAVENOUS | Status: DC | PRN
  Administered 2019-11-28: 5 mg via INTRAVENOUS
  Administered 2019-11-28: 10 mg via INTRAVENOUS
  Administered 2019-11-28: 5 mg via INTRAVENOUS

## 2019-11-28 MED ORDER — HYDRALAZINE HCL 20 MG/ML IJ SOLN
10.0000 mg | Freq: Once | INTRAMUSCULAR | Status: AC
  Administered 2019-11-28: 10 mg via INTRAVENOUS

## 2019-11-28 MED ORDER — METOPROLOL TARTRATE 25 MG PO TABS
25.0000 mg | ORAL_TABLET | Freq: Two times a day (BID) | ORAL | Status: DC
  Administered 2019-11-28 – 2019-11-29 (×2): 25 mg via ORAL
  Filled 2019-11-28 (×2): qty 1

## 2019-11-28 MED ORDER — FENTANYL CITRATE (PF) 100 MCG/2ML IJ SOLN
25.0000 ug | INTRAMUSCULAR | Status: DC | PRN
  Administered 2019-11-28: 50 ug via INTRAVENOUS

## 2019-11-28 MED ORDER — ACETAMINOPHEN 500 MG PO TABS: 1000.0000 mg | ORAL_TABLET | Freq: Once | ORAL | Status: AC

## 2019-11-28 MED ORDER — ENOXAPARIN SODIUM 30 MG/0.3ML ~~LOC~~ SOLN
30.0000 mg | Freq: Two times a day (BID) | SUBCUTANEOUS | Status: DC
  Administered 2019-11-28 – 2019-11-29 (×2): 30 mg via SUBCUTANEOUS
  Filled 2019-11-28 (×2): qty 0.3

## 2019-11-28 MED ORDER — ONDANSETRON HCL 4 MG/2ML IJ SOLN
INTRAMUSCULAR | Status: AC
  Filled 2019-11-28: qty 2

## 2019-11-28 MED ORDER — BUPIVACAINE HCL 0.25 % IJ SOLN
INTRAMUSCULAR | Status: DC | PRN
  Administered 2019-11-28: 30 mL

## 2019-11-28 MED ORDER — ENSURE MAX PROTEIN PO LIQD
2.0000 [oz_av] | ORAL | Status: DC
  Administered 2019-11-29 (×2): 2 [oz_av] via ORAL

## 2019-11-28 MED ORDER — DEXAMETHASONE SODIUM PHOSPHATE 10 MG/ML IJ SOLN
INTRAMUSCULAR | Status: AC
  Filled 2019-11-28: qty 1

## 2019-11-28 MED ORDER — PHENYLEPHRINE 40 MCG/ML (10ML) SYRINGE FOR IV PUSH (FOR BLOOD PRESSURE SUPPORT)
PREFILLED_SYRINGE | INTRAVENOUS | Status: DC | PRN
  Administered 2019-11-28: 80 ug via INTRAVENOUS

## 2019-11-28 MED ORDER — ONDANSETRON HCL 4 MG/2ML IJ SOLN
INTRAMUSCULAR | Status: DC | PRN
  Administered 2019-11-28: 4 mg via INTRAVENOUS

## 2019-11-28 SURGICAL SUPPLY — 71 items
APPLIER CLIP 5 13 M/L LIGAMAX5 (MISCELLANEOUS)
APPLIER CLIP ROT 10 11.4 M/L (STAPLE)
APPLIER CLIP ROT 13.4 12 LRG (CLIP)
BENZOIN TINCTURE PRP APPL 2/3 (GAUZE/BANDAGES/DRESSINGS) ×2 IMPLANT
BLADE SURG SZ11 CARB STEEL (BLADE) ×3 IMPLANT
BNDG ADH 1X3 SHEER STRL LF (GAUZE/BANDAGES/DRESSINGS) ×2 IMPLANT
CABLE HIGH FREQUENCY MONO STRZ (ELECTRODE) ×3 IMPLANT
CHLORAPREP W/TINT 26 (MISCELLANEOUS) ×3 IMPLANT
CLIP APPLIE 5 13 M/L LIGAMAX5 (MISCELLANEOUS) IMPLANT
CLIP APPLIE ROT 10 11.4 M/L (STAPLE) IMPLANT
CLIP APPLIE ROT 13.4 12 LRG (CLIP) IMPLANT
CLOSURE WOUND 1/2 X4 (GAUZE/BANDAGES/DRESSINGS) ×1
COVER SURGICAL LIGHT HANDLE (MISCELLANEOUS) ×3 IMPLANT
COVER WAND RF STERILE (DRAPES) ×3 IMPLANT
DEVICE SUTURE ENDOST 10MM (ENDOMECHANICALS) ×3 IMPLANT
DRAIN CHANNEL 19F RND (DRAIN) IMPLANT
DRAIN PENROSE 0.25X18 (DRAIN) ×3 IMPLANT
ELECT L-HOOK LAP 45CM DISP (ELECTROSURGICAL) ×3
ELECTRODE L-HOOK LAP 45CM DISP (ELECTROSURGICAL) ×1 IMPLANT
EVACUATOR SILICONE 100CC (DRAIN) IMPLANT
GAUZE 4X4 16PLY RFD (DISPOSABLE) ×3 IMPLANT
GAUZE SPONGE 4X4 12PLY STRL (GAUZE/BANDAGES/DRESSINGS) IMPLANT
GLOVE BIOGEL PI IND STRL 7.0 (GLOVE) ×1 IMPLANT
GLOVE BIOGEL PI INDICATOR 7.0 (GLOVE) ×2
GLOVE SURG SS PI 7.0 STRL IVOR (GLOVE) ×3 IMPLANT
GOWN STRL REUS W/TWL LRG LVL3 (GOWN DISPOSABLE) ×3 IMPLANT
GOWN STRL REUS W/TWL XL LVL3 (GOWN DISPOSABLE) ×9 IMPLANT
GRASPER SUT TROCAR 14GX15 (MISCELLANEOUS) IMPLANT
HANDLE STAPLE EGIA 4 XL (STAPLE) ×3 IMPLANT
HOVERMATT SINGLE USE (MISCELLANEOUS) ×3 IMPLANT
KIT BASIN OR (CUSTOM PROCEDURE TRAY) ×3 IMPLANT
KIT GASTRIC LAVAGE 34FR ADT (SET/KITS/TRAYS/PACK) IMPLANT
KIT TURNOVER KIT A (KITS) IMPLANT
MARKER SKIN DUAL TIP RULER LAB (MISCELLANEOUS) ×3 IMPLANT
NDL SPNL 22GX3.5 QUINCKE BK (NEEDLE) ×1 IMPLANT
NEEDLE SPNL 22GX3.5 QUINCKE BK (NEEDLE) ×3 IMPLANT
PACK CARDIOVASCULAR III (CUSTOM PROCEDURE TRAY) ×3 IMPLANT
PENCIL SMOKE EVACUATOR (MISCELLANEOUS) IMPLANT
RELOAD EGIA 45 MED/THCK PURPLE (STAPLE) ×3 IMPLANT
RELOAD EGIA 45 TAN VASC (STAPLE) IMPLANT
RELOAD EGIA 60 MED/THCK PURPLE (STAPLE) ×12 IMPLANT
RELOAD EGIA 60 TAN VASC (STAPLE) ×9 IMPLANT
RELOAD ENDO STITCH 2.0 (ENDOMECHANICALS) ×22
RELOAD STAPLE 60 MED/THCK ART (STAPLE) ×4 IMPLANT
RELOAD SUT SNGL STCH ABSRB 2-0 (ENDOMECHANICALS) ×5 IMPLANT
RELOAD SUT SNGL STCH BLK 2-0 (ENDOMECHANICALS) ×6 IMPLANT
SCISSORS LAP 5X45 EPIX DISP (ENDOMECHANICALS) ×3 IMPLANT
SET IRRIG TUBING LAPAROSCOPIC (IRRIGATION / IRRIGATOR) ×3 IMPLANT
SET TUBE SMOKE EVAC HIGH FLOW (TUBING) ×3 IMPLANT
SHEARS HARMONIC ACE PLUS 45CM (MISCELLANEOUS) ×3 IMPLANT
SLEEVE XCEL OPT CAN 5 100 (ENDOMECHANICALS) ×9 IMPLANT
SOL ANTI FOG 6CC (MISCELLANEOUS) ×1 IMPLANT
SOLUTION ANTI FOG 6CC (MISCELLANEOUS) ×2
STAPLER VISISTAT 35W (STAPLE) IMPLANT
STRIP CLOSURE SKIN 1/2X4 (GAUZE/BANDAGES/DRESSINGS) ×1 IMPLANT
SUT ETHILON 2 0 PS N (SUTURE) IMPLANT
SUT MNCRL AB 4-0 PS2 18 (SUTURE) ×3 IMPLANT
SUT RELOAD ENDO STITCH 2 48X1 (ENDOMECHANICALS) ×5
SUT RELOAD ENDO STITCH 2.0 (ENDOMECHANICALS) ×6
SUT SILK 0 SH 30 (SUTURE) IMPLANT
SUT VICRYL 0 TIES 12 18 (SUTURE) IMPLANT
SUTURE RELOAD END STTCH 2 48X1 (ENDOMECHANICALS) ×5 IMPLANT
SUTURE RELOAD ENDO STITCH 2.0 (ENDOMECHANICALS) ×6 IMPLANT
SYR 20ML LL LF (SYRINGE) ×3 IMPLANT
SYR 50ML LL SCALE MARK (SYRINGE) ×3 IMPLANT
TOWEL OR 17X26 10 PK STRL BLUE (TOWEL DISPOSABLE) ×3 IMPLANT
TOWEL OR NON WOVEN STRL DISP B (DISPOSABLE) ×3 IMPLANT
TROCAR BLADELESS OPT 5 100 (ENDOMECHANICALS) ×3 IMPLANT
TROCAR XCEL 12X100 BLDLESS (ENDOMECHANICALS) ×3 IMPLANT
TUBING CONNECTING 10 (TUBING) ×2 IMPLANT
TUBING CONNECTING 10' (TUBING) ×1

## 2019-11-28 NOTE — Transfer of Care (Signed)
Immediate Anesthesia Transfer of Care Note  Patient: [name]  Procedure(s) Performed: LAPAROSCOPIC ROUX-EN-Y GASTRIC BYPASS WITH UPPER ENDOSCOPY, ERAS Pathway (N/A )  Patient Location: PACU  Anesthesia Type:General  Level of Consciousness: awake and patient cooperative  Airway & Oxygen Therapy: Patient Spontanous Breathing and Patient connected to face mask  Post-op Assessment: Report given to RN and Post -op Vital signs reviewed and stable  Post vital signs: Reviewed and stable  Last Vitals:  Vitals Value Taken Time  BP    Temp    Pulse 49 11/28/19 0939  Resp 10 11/28/19 0939  SpO2 99 % 11/28/19 0939  Vitals shown include unvalidated device data.  Last Pain:  Vitals:   11/28/19 0549  TempSrc: Oral         Complications: No apparent anesthesia complications

## 2019-11-28 NOTE — Op Note (Signed)
Preoperative diagnosis: Roux-en-Y gastric bypass  Postoperative diagnosis: Same   Procedure: Upper endoscopy   Surgeon: Clovis Riley, M.D.  Anesthesia: Gen.   Description of procedure: The endoscopy was placed in the mouth and into the oropharynx and under endoscopic vision it was advanced to the esophagogastric junction which was identified at 40 from the gums.  The pouch was tensely insufflated while the upper abdomen was flooded with irrigation and the roux limb occluded to perform a leak test, which was negative. No bubbles were seen.  The staple line and anastomosis were hemostatic. The lumen was decompressed and the scope was withdrawn without difficulty.    Clovis Riley, M.D. General, Bariatric, & Minimally Invasive Surgery [facility]

## 2019-11-28 NOTE — Anesthesia Postprocedure Evaluation (Signed)
Anesthesia Post Note  Patient: [name]  Procedure(s) Performed: LAPAROSCOPIC ROUX-EN-Y GASTRIC BYPASS WITH UPPER ENDOSCOPY, ERAS Pathway (N/A )     Patient location during evaluation: PACU Anesthesia Type: General Level of consciousness: awake and alert Pain management: pain level controlled Vital Signs Assessment: post-procedure vital signs reviewed and stable Respiratory status: spontaneous breathing, nonlabored ventilation, respiratory function stable and patient connected to nasal cannula oxygen Cardiovascular status: blood pressure returned to baseline and stable Postop Assessment: no apparent nausea or vomiting Anesthetic complications: no    Last Vitals:  Vitals:   11/28/19 1319 11/28/19 1415  BP: (!) 180/73 (!) 176/68  Pulse: (!) 56 63  Resp: 17 17  Temp: (!) 36.3 C 36.4 C  SpO2: 98% 99%    Last Pain:  Vitals:   11/28/19 1415  TempSrc: Oral  PainSc:                  Catalina Gravel

## 2019-11-28 NOTE — Op Note (Signed)
Preop Diagnosis: Obesity Class III  Postop Diagnosis: same  Procedure performed: laparoscopic Roux en Y gastric bypass  Assitant: Romana Juniper  Indications:  The patient is a 69 y.o. year-old morbidly obese male who has been followed in the Bariatric Clinic as an outpatient. This patient was diagnosed with morbid obesity with a BMI of Body mass index is 41.56 kg/m. and significant co-morbidities including hypertension, insulin dependent diabetes, GERD and sleep apnea and hyperlipidemia.  The patient was counseled extensively in the Bariatric Outpatient Clinic and after a thorough explanation of the risks and benefits of surgery (including death from complications, bowel leak, infection such as peritonitis and/or sepsis, internal hernia, bleeding, need for blood transfusion, bowel obstruction, organ failure, pulmonary embolus, deep venous thrombosis, wound infection, incisional hernia, skin breakdown, and others entailed on the consent form) and after a compliant diet and exercise program, the patient was scheduled for an elective laparoscopic gastric bypass.  Description of Operation:  Following informed consent, the patient was taken to the operating room and placed on the operating table in the supine position.  He had previously received prophylactic antibiotics and subcutaneous heparin for DVT prophylaxis in the pre-op holding area.  After induction of general endotracheal anesthesia by the anesthesiologist, the patient underwent placement of sequential compression devices, Foley catheter and an oro-gastric tube.  A timeout was confirmed by the surgery and anesthesia teams.  The patient was adequately padded at all pressure points and placed on a footboard to prevent slippage from the OR table during extremes of position during surgery.  He underwent a routine sterile prep and drape of her entire abdomen.    Next, A transverse incision was made under the left subcostal area and a 49mm optical  viewing trocar was introduced into the peritoneal cavity. Pneumoperitoneum was applied with a high flow and low pressure. A laparoscope was inserted to confirm placement. A extraperitoneal block was then placed at the lateral abdominal wall using exparel diluted with marcaine . 5 additional trocars were placed: 1 25mm trocar to the left of the midline. 1 additional 32mm trocar in the left lateral area, 1 71mm trocar in the right mid abdomen, and 1 40mm trocar in the right subcostal area.  The greater omentum was flipped over the transverse colon and under the left lobe of the liver. The ligament of trietz was identified. 40cm of jejunum was measured starting from the ligament of Trietz. The mesentery was checked to ensure mobility. Next, a 34mm 2-35mm tristapler was used to divide the jejunum at this location. The harmonic scalpel was used to divide the mesentery down to the origin. A 1/2" penrose was sutured to the distal side. 100cm of jejunum was measured starting at the division. 2-0 silk was used to appose the biliary limb to the 100cm mark of jejunum in 2 places. Enterotomies were made in the biliary and common channels and a 16mm 2-3 tristapler was used to create the J-J anastomosis. A 2-0 silk was used to appose the enterotomy edges and a 662mm 2-3 tristapler was used to close the enterotomy. An anti-obstruction 2-0 silk suture was placed. There was one area of the roux mesentery edge with bleeding that was sealed with harmonic scalpel with hemostatic result. Next, the mesenteric defect was closed with a 2-0 silk in running fashion.The J-J appeared patent and in neutral position.  Next, the omentum was divided using the Harmonic scalpel. The patient was placed in steep Reverse Trendelenberg position. A Nathanson retracted was placed through a  subxiphoid incision and used to retract the liver. The liver had some cobblestoning changes compatible with liver disease. The fat pad over the fundus was incised to  free the fundus. Next, a position along the lesser curve 6cm from GE junction was identified. The pars flaccida was entered and the fat over the lesser curve divided to enter the lesser sac. Multiple 47mm 3-34mm tristaple firings were peformed to create a 6cm pouch. The Roux limb was identified using the placed penrose and brought up to the stomach in antecolic fashion. The limb was inspected to ensure a neutral position. A 2-0 vicryl suture was then used to create a posterior layer connecting the stomach to the Roux limb jejunum in running fashion. Next cautery was used to create an enterotomy along the medial aspect of this suture line and Harmonic scalpel used to create gastotomy. A 68mm 3-40mm tristapler was then used to create a 25-94mm anastomosis. 2 2-0 vicryl sutures were used in running fashion to close the gastrotomy. Finally, a 2-0 vicryl suture was used to close an anterior layer of stomach and jejunum over the anastomosis in running fashion. The penrose was removed from the Roux limb. A 2-0 silk was used to appose the transverse mesocolon to the mesentery of the Roux limb.  The assistant then went and performed an upper endoscopy and leak test. No bubbles were seen and the pouch and limb distended appropriately. The limb and pouch were deflated, the endoscope was removed. Hemostasis was ensured. Pneumoperitoneum was evacuated, all ports were removed and all incisions closed with 4-0 monocryl suture in subcuticular fashion. Steristrips and bandaids were put in place for dressing. The patient awoke from anesthesia and was brought to pacu in stable condition. All counts were correct.  Specimens:  None  Estimated Blood Loss: 20 ml  Local Anesthesia: 50 ml Exparel: 0.5% Marcaine Mix  Post-Op Plan:       Pain Management: PO, prn      Antibiotics: Prophylactic      Anticoagulation: Prophylactic, Starting now      Post Op Studies/Consults: Not applicable      Intended Discharge: within 48h       Intended Outpatient Follow-Up: Two Week      Intended Outpatient Studies: Not Applicable      Other: Not Applicable  Jesse Guerrero

## 2019-11-28 NOTE — Progress Notes (Signed)
Discussed post op day goals with patient including ambulation, IS, diet progression, pain, and nausea control.  BSTOP education provided including BSTOP information guide, "Guide for Pain Management after your Bariatric Procedure".  Questions answered. 

## 2019-11-28 NOTE — Progress Notes (Signed)
Lower incision bleeding when up to bathroom, gown with blood and bathroom floor with blood.  2x2 and tegaderm applied to incision, patient changed to own clothing for comfort.  Patient reports blood in urine when used restroom, BNC did not visualize reported to bedside RN for follow up.

## 2019-11-28 NOTE — Discharge Instructions (Signed)
Enoxaparin injection What is this medicine? ENOXAPARIN (ee nox a PA rin) is used after knee, hip, or abdominal surgeries to prevent blood clotting. It is also used to treat existing blood clots in the lungs or in the veins. This medicine may be used for other purposes; ask your health care provider or pharmacist if you have questions. COMMON BRAND NAME(S): Lovenox What should I tell my health care provider before I take this medicine? They need to know if you have any of these conditions:  bleeding disorders, hemorrhage, or hemophilia  infection of the heart or heart valves  kidney or liver disease  previous stroke  prosthetic heart valve  recent surgery or delivery of a baby  ulcer in the stomach or intestine, diverticulitis, or other bowel disease  an unusual or allergic reaction to enoxaparin, heparin, pork or pork products, other medicines, foods, dyes, or preservatives  pregnant or trying to get pregnant  breast-feeding How should I use this medicine? This medicine is for injection under the skin. It is usually given by a health-care professional. You or a family member may be trained on how to give the injections. If you are to give yourself injections, make sure you understand how to use the syringe, measure the dose if necessary, and give the injection. To avoid bruising, do not rub the site where this medicine has been injected. Do not take your medicine more often than directed. Do not stop taking except on the advice of your doctor or health care professional. Make sure you receive a puncture-resistant container to dispose of the needles and syringes once you have finished with them. Do not reuse these items. Return the container to your doctor or health care professional for proper disposal. Talk to your pediatrician regarding the use of this medicine in children. Special care may be needed. Overdosage: If you think you have taken too much of this medicine contact a poison  control center or emergency room at once. NOTE: This medicine is only for you. Do not share this medicine with others. What if I miss a dose? If you miss a dose, take it as soon as you can. If it is almost time for your next dose, take only that dose. Do not take double or extra doses. What may interact with this medicine?  aspirin and aspirin-like medicines  certain medicines that treat or prevent blood clots  dipyridamole  NSAIDs, medicines for pain and inflammation, like ibuprofen or naproxen This list may not describe all possible interactions. Give your health care provider a list of all the medicines, herbs, non-prescription drugs, or dietary supplements you use. Also tell them if you smoke, drink alcohol, or use illegal drugs. Some items may interact with your medicine. What should I watch for while using this medicine? Visit your healthcare professional for regular checks on your progress. You may need blood work done while you are taking this medicine. Your condition will be monitored carefully while you are receiving this medicine. It is important not to miss any appointments. If you are going to need surgery or other procedure, tell your healthcare professional that you are using this medicine. Using this medicine for a long time may weaken your bones and increase the risk of bone fractures. Avoid sports and activities that might cause injury while you are using this medicine. Severe falls or injuries can cause unseen bleeding. Be careful when using sharp tools or knives. Consider using an Copy. Take special care brushing or flossing your  teeth. Report any injuries, bruising, or red spots on the skin to your healthcare professional. Wear a medical ID bracelet or chain. Carry a card that describes your disease and details of your medicine and dosage times. What side effects may I notice from receiving this medicine? Side effects that you should report to your doctor or health  care professional as soon as possible:  allergic reactions like skin rash, itching or hives, swelling of the face, lips, or tongue  bone pain  signs and symptoms of bleeding such as bloody or black, tarry stools; red or dark-brown urine; spitting up blood or brown material that looks like coffee grounds; red spots on the skin; unusual bruising or bleeding from the eye, gums, or nose  signs and symptoms of a blood clot such as chest pain; shortness of breath; pain, swelling, or warmth in the leg  signs and symptoms of a stroke such as changes in vision; confusion; trouble speaking or understanding; severe headaches; sudden numbness or weakness of the face, arm or leg; trouble walking; dizziness; loss of coordination Side effects that usually do not require medical attention (report to your doctor or health care professional if they continue or are bothersome):  hair loss  pain, redness, or irritation at site where injected This list may not describe all possible side effects. Call your doctor for medical advice about side effects. You may report side effects to FDA at 1-800-FDA-1088. Where should I keep my medicine? Keep out of the reach of children. Store at room temperature between 15 and 30 degrees C (59 and 86 degrees F). Do not freeze. If your injections have been specially prepared, you may need to store them in the refrigerator. Ask your pharmacist. Throw away any unused medicine after the expiration date. NOTE: This sheet is a summary. It may not cover all possible information. If you have questions about this medicine, talk to your doctor, pharmacist, or health care provider.  2020 Elsevier/Gold Standard (2017-07-30 11:25:34)     GASTRIC BYPASS/SLEEVE  Home Care Instructions   These instructions are to help you care for yourself when you go home.  Call: If you have any problems. . Call [phone] and ask for the surgeon on call . If you need immediate help, come to the ER  at [facility].  . Tell the ER staff that you are a new post-op gastric bypass or gastric sleeve patient   Signs and symptoms to report: . Severe vomiting or nausea o If you cannot keep down clear liquids for longer than 1 day, call your surgeon  . Abdominal pain that does not get better after taking your pain medication . Fever over 100.4 F with chills . Heart beating over 100 beats a minute . Shortness of breath at rest . Chest pain .  Redness, swelling, drainage, or foul odor at incision (surgical) sites .  If your incisions open or pull apart . Swelling or pain in calf (lower leg) . Diarrhea (Loose bowel movements that happen often), frequent watery, uncontrolled bowel movements . Constipation, (no bowel movements for 3 days) if this happens: Pick one o Milk of Magnesia, 2 tablespoons by mouth, 3 times a day for 2 days if needed o Stop taking Milk of Magnesia once you have a bowel movement o Call your doctor if constipation continues Or o Miralax  (instead of Milk of Magnesia) following the label instructions o Stop taking Miralax once you have a bowel movement o Call your doctor if constipation  continues . Anything you think is not normal   Normal side effects after surgery: . Unable to sleep at night or unable to focus . Irritability or moody . Being tearful (crying) or depressed These are common complaints, possibly related to your anesthesia medications that put you to sleep, stress of surgery, and change in lifestyle.  This usually goes away a few weeks after surgery.  If these feelings continue, call your primary care doctor.   Wound Care: You may have surgical glue, steri-strips, or staples over your incisions after surgery . Surgical glue:  Looks like a clear film over your incisions and will wear off a little at a time . Steri-strips: Strips of tape over your incisions. You may notice a yellowish color on the skin under the steri-strips. This is used to make the    steri-strips stick better. Do not pull the steri-strips off - let them fall off . Staples: Staples may be removed before you leave the hospital o If you go home with staples, call [facility], [phone] at for an appointment with your surgeon's nurse to have staples removed 10 days after surgery. . Showering: You may shower two (2) days after your surgery unless your surgeon tells you differently o Wash gently around incisions with warm soapy water, rinse well, and gently pat dry  o No tub baths until staples are removed, steri-strips fall off or glue is gone.    Medications: . Medications should be liquid or crushed if larger than the size of a dime . Extended release pills (medication that release a little bit at a time through the day) should NOT be crushed or cut. (examples include XL, ER, DR, SR) . Depending on the size and number of medications you take, you may need to space (take a few throughout the day)/change the time you take your medications so that you do not over-fill your pouch (smaller stomach) . Make sure you follow-up with your primary care doctor to make medication changes needed during rapid weight loss and life-style changes . If you have diabetes, follow up with the doctor that orders your diabetes medication(s) within one week after surgery and check your blood sugar regularly. . Do not drive while taking prescription pain medication  . It is ok to take Tylenol by the bottle instructions with your pain medicine or instead of your pain medicine as needed.  DO NOT TAKE NSAIDS (EXAMPLES OF NSAIDS:  IBUPROFREN/ NAPROXEN)  Diet:                    First 2 Weeks  You will see the dietician t about two (2) weeks after your surgery. The dietician will increase the types of foods you can eat if you are handling liquids well: . If you have severe vomiting or nausea and cannot keep down clear liquids lasting longer than 1 day, call your surgeon @ [phone]) Protein  Shake . Drink at least 2 ounces of shake 5-6 times per day . Each serving of protein shakes (usually 8 - 12 ounces) should have: o 15 grams of protein  o And no more than 5 grams of carbohydrate  . Goal for protein each day: o Men = 80 grams per day o Women = 60 grams per day . Protein powder may be added to fluids such as non-fat milk or Lactaid milk or unsweetened Soy/Almond milk (limit to 35 grams added protein powder per serving)  Hydration . Slowly increase the   amount of water and other clear liquids as tolerated (See Acceptable Fluids) . Slowly increase the amount of protein shake as tolerated  .  Sip fluids slowly and throughout the day.  Do not use straws. . May use sugar substitutes in small amounts (no more than 6 - 8 packets per day; i.e. Splenda)  Fluid Goal . The first goal is to drink at least 8 ounces of protein shake/drink per day (or as directed by the nutritionist); some examples of protein shakes are [employer], AMR [employer], EAS Edge HP, and Unjury. See handout from pre-op Bariatric Education Class: o Slowly increase the amount of protein shake you drink as tolerated o You may find it easier to slowly sip shakes throughout the day o It is important to get your proteins in first . Your fluid goal is to drink 64 - 100 ounces of fluid daily o It may take a few weeks to build up to this . 32 oz (or more) should be clear liquids  And  . 32 oz (or more) should be full liquids (see below for examples) . Liquids should not contain sugar, caffeine, or carbonation  Clear Liquids: . Water or Sugar-free flavored water (i.e. Fruit H2O, Propel) . Decaffeinated coffee or tea (sugar-free) . [employer], [employer], Minute [facility] . Sugar-free Jell-O . Bouillon or broth . Sugar-free Popsicle:   *Less than 20 calories each; Limit 1 per day  Full Liquids: Protein Shakes/Drinks + 2 choices per day of other full liquids . Full liquids must be: o No More Than 15  grams of Carbs per serving  o No More Than 3 grams of Fat per serving . Strained low-fat cream soup (except Cream of Potato or Tomato) . Non-Fat milk . Fat-free Lactaid Milk . Unsweetened Soy Or Unsweetened Almond Milk . Low Sugar yogurt (Dannon Lite & Fit, [place] yogurt; Oikos Triple Zero; Chobani Simply 100; Yoplait 100 calorie [place] - No Fruit on the Bottom)    Vitamins and Minerals . Start 1 day after surgery unless otherwise directed by your surgeon . 2 Chewable Bariatric Specific Multivitamin / Multimineral Supplement with iron (Example: Bariatric Advantage Multi EA) . Chewable Calcium with Vitamin D-3 (Example: 3 Chewable Calcium Plus 600 with Vitamin D-3) o Take 500 mg three (3) times a day for a total of 1500 mg each day o Do not take all 3 doses of calcium at one time as it may cause constipation, and you can only absorb 500 mg  at a time  o Do not mix multivitamins containing iron with calcium supplements; take 2 hours apart . Menstruating women and those with a history of anemia (a blood disease that causes weakness) may need extra iron o Talk with your doctor to see if you need more iron . Do not stop taking or change any vitamins or minerals until you talk to your dietitian or surgeon . Your Dietitian and/or surgeon must approve all vitamin and mineral supplements   Activity and Exercise: Limit your physical activity as instructed by your doctor.  It is important to continue walking at home.  During this time, use these guidelines: . Do not lift anything greater than ten (10) pounds for at least two (2) weeks . Do not go back to work or drive until Engineer, production says you can . You may have sex when you feel comfortable  o It is VERY important for male patients to use a reliable birth control method; fertility often increases after  surgery  o All hormonal birth control will be ineffective for 30 days after surgery due to medications given during surgery a barrier method must be  used. o Do not get pregnant for at least 18 months . Start exercising as soon as your doctor tells you that you can o Make sure your doctor approves any physical activity . Start with a simple walking program . Walk 5-15 minutes each day, 7 days per week.  . Slowly increase until you are walking 30-45 minutes per day Consider joining our [place] program. [phone] or email [email or web address]   Special Instructions Things to remember: . Use your CPAP when sleeping if this applies to you  . [facility] has two free Bariatric Surgery Support Groups that meet monthly o The 3rd Thursday of each month, 6 pm, [facility]  o The 2nd Friday of each month, 11:45 am in the private dining room in the basement of [facility] [employer] . It is very important to keep all follow up appointments with your surgeon, dietitian, primary care physician, and behavioral health practitioner . Routine follow up schedule with your surgeon include appointments at 2-3 weeks, 6-8 weeks, 6 months, and 1 year at a minimum.  Your surgeon may request to see you more often.   o After the first year, please follow up with your bariatric surgeon and dietitian at least once a year in order to maintain best weight loss results [facility]: [place]: [phone] Bariatric Nurse Coordinator: [phone]      Reviewed and Endorsed  by [facility] Patient Education Committee, June, 2016 Edits Approved: Aug, 2018

## 2019-11-28 NOTE — Anesthesia Procedure Notes (Signed)
Procedure Name: Intubation Date/Time: 11/28/2019 7:28 AM Performed by: Claudia Desanctis, CRNA Pre-anesthesia Checklist: Patient identified, Emergency Drugs available, Suction available and Patient being monitored Patient Re-evaluated:Patient Re-evaluated prior to induction Oxygen Delivery Method: Circle system utilized Preoxygenation: Pre-oxygenation with 100% oxygen Induction Type: IV induction Ventilation: Mask ventilation without difficulty and Oral airway inserted - appropriate to patient size Laryngoscope Size: 2 and Miller Grade View: Grade I Tube type: Oral Tube size: 8.0 mm Number of attempts: 1 Airway Equipment and Method: Stylet Placement Confirmation: ETT inserted through vocal cords under direct vision,  positive ETCO2 and breath sounds checked- equal and bilateral Secured at: 22 cm Tube secured with: Tape Dental Injury: Teeth and Oropharynx as per pre-operative assessment

## 2019-11-28 NOTE — H&P (Signed)
Jesse Guerrero is an 69 y.o. male.   Chief Complaint: obesity HPI: 69 yo male with diabetes, reflux and class III obesity. He has completed all requirements and is ready to proceed with bariatric surgery.  Past Medical History:  Diagnosis Date  . Asthma    as child  . Complication of anesthesia    30 years ago, "awoke prematurely" during dental procedure  . Diabetes mellitus without complication ([place])   . GERD (gastroesophageal reflux disease)   . Hepatitis C   . High cholesterol   . Hypertension   . Sleep apnea    wears cpap     Past Surgical History:  Procedure Laterality Date  . BACK SURGERY    . CHOLECYSTECTOMY    . COLONOSCOPY      x 3 per pt - all normal - last colon 10 yrs ago either in [place] or [place]  . thumb surgery Left     Family History  Problem Relation Age of Onset  . Diabetes Father   . Colon cancer Neg Hx   . Esophageal cancer Neg Hx   . Stomach cancer Neg Hx   . Rectal cancer Neg Hx   . Colon polyps Neg Hx    Social History:  reports that he has quit smoking. He has never used smokeless tobacco. He reports current alcohol use. He reports previous drug use. Drug: Marijuana.  Allergies: No Known Allergies  Medications Prior to Admission  Medication Sig Dispense Refill  . amLODipine (NORVASC) 5 MG tablet Take 5 mg by mouth at bedtime.     Marland Kitchen aspirin EC 81 MG tablet Take 81 mg by mouth at bedtime.     Marland Kitchen atorvastatin (LIPITOR) 40 MG tablet Take 40 mg by mouth at bedtime.     Marland Kitchen esomeprazole (NEXIUM) 20 MG capsule Take 20 mg by mouth at bedtime.     . gabapentin (NEURONTIN) 300 MG capsule Take 900 mg by mouth 2 (two) times daily as needed (pain).     . hydrALAZINE (APRESOLINE) 25 MG tablet Take 25 mg by mouth 3 (three) times daily as needed (for a B/P >180/100).    Marland Kitchen lisinopril (ZESTRIL) 40 MG tablet Take 40 mg by mouth at bedtime.    . metoprolol tartrate (LOPRESSOR) 25 MG tablet Take 25 mg by mouth 2 (two) times daily.    Marland Kitchen NOVOLIN R RELION 100  UNIT/ML injection For use in pump, for a total of 240 units per day. 80 mL 11  . Continuous Blood Gluc Sensor (DEXCOM G6 SENSOR) MISC 1 each by Does not apply route See admin instructions. For use with continuous glucose monitoring system. Change sensor every 10 days; E11.9 3 each 2  . Insulin Infusion Pump Supplies (PARADIGM RESERVOIR 3ML) MISC 1 Device by Does not apply route daily. 90 each 3  . Insulin Infusion Pump Supplies (QUICK-SET INFUSION 43" 9MM) MISC 1 Device by Does not apply route daily. 90 each 3  . lisinopril (PRINIVIL,ZESTRIL) 30 MG tablet Take 1 tablet (30 mg total) by mouth at bedtime. (Patient not taking: Reported on 11/16/2019) 30 tablet 0    Results for orders placed or performed during the hospital encounter of 11/28/19 (from the past 48 hour(s))  Glucose, capillary     Status: Abnormal   Collection Time: 11/28/19  5:46 AM  Result Value Ref Range   Glucose-Capillary 202 (H) 70 - 99 mg/dL    Comment: Glucose reference range applies only to samples taken after fasting for at  least 8 hours.   No results found.  Review of Systems  Constitutional: Negative for chills and fever.  HENT: Negative for hearing loss.   Respiratory: Negative for cough.   Cardiovascular: Negative for chest pain and palpitations.  Gastrointestinal: Negative for abdominal pain, nausea and vomiting.  Genitourinary: Negative for dysuria and urgency.  Musculoskeletal: Negative for myalgias and neck pain.  Skin: Negative for rash.  Neurological: Negative for dizziness and headaches.  Hematological: Does not bruise/bleed easily.  Psychiatric/Behavioral: Negative for suicidal ideas.   Blood pressure (!) 154/73, pulse (!) 51, temperature 98.2 F (36.8 C), temperature source Oral, resp. rate 16, height 5\' 11"  (1.803 m), weight 135.2 kg, SpO2 97 %. Physical Exam  Vitals reviewed. Constitutional: He is oriented to person, place, and time. He appears well-developed and well-nourished.  HENT:  Head:  Normocephalic and atraumatic.  Eyes: Pupils are equal, round, and reactive to light. Conjunctivae and EOM are normal.  Cardiovascular: Normal rate and regular rhythm.  Respiratory: Effort normal and breath sounds normal.  GI: Soft. Bowel sounds are normal. He exhibits no distension. There is no abdominal tenderness.  Musculoskeletal:        General: Normal range of motion.     Cervical back: Normal range of motion and neck supple.  Neurological: He is alert and oriented to person, place, and time.  Skin: Skin is warm and dry.  Psychiatric: He has a normal mood and affect. His behavior is normal.    Assessment/Plan 69 yo male with multiple comorbidities and class III obesity -lap RNY gastric bypass -ERAS and bariatric protocol  Mickeal Skinner, MD 11/28/2019, 7:06 AM

## 2019-11-28 NOTE — Progress Notes (Signed)
PHARMACY CONSULT FOR:  Risk Assessment for Post-Discharge VTE Following Bariatric Surgery  Post-Discharge VTE Risk Assessment: This patient's probability of 30-day post-discharge VTE is increased due to the factors marked: X  Male  X  Age >/=60 years    BMI >/=50 kg/m2    CHF    Dyspnea at Rest    Paraplegia  X  Non-gastric-band surgery    Operation Time >/=3 hr    Return to OR     Length of Stay >/= 3 d      Hx of VTE   Hypercoagulable condition   Significant venous stasis   Predicted probability of 30-day post-discharge VTE: 0.6 % Other patient-specific factors to consider: none   Recommendation for Discharge: Enoxaparin 40 mg [place] q12h x 2 weeks post-discharge  Jesse Guerrero is a 69 y.o. male who underwent laparoscopic Roux-en-Y gastric bypass 11/28/2019   No Known Allergies  Patient Measurements: Height: 5\' 11"  (180.3 cm) Weight: 135.2 kg (298 lb) IBW/kg (Calculated) : 75.3 Body mass index is 41.56 kg/m.  No results for input(s): WBC, HGB, HCT, PLT, APTT, CREATININE, LABCREA, CREATININE, CREAT24HRUR, MG, PHOS, ALBUMIN, PROT, ALBUMIN, AST, ALT, ALKPHOS, BILITOT, BILIDIR, IBILI in the last 72 hours. Estimated Creatinine Clearance: 124.1 mL/min (by C-G formula based on SCr of 0.74 mg/dL).    Past Medical History:  Diagnosis Date  . Asthma    as child  . Complication of anesthesia    30 years ago, "awoke prematurely" during dental procedure  . Diabetes mellitus without complication ([place])   . GERD (gastroesophageal reflux disease)   . Hepatitis C   . High cholesterol   . Hypertension   . Sleep apnea    wears cpap      Medications Prior to Admission  Medication Sig Dispense Refill Last Dose  . amLODipine (NORVASC) 5 MG tablet Take 5 mg by mouth at bedtime.    11/27/2019 at Unknown time  . aspirin EC 81 MG tablet Take 81 mg by mouth at bedtime.    11/23/2019  . atorvastatin (LIPITOR) 40 MG tablet Take 40 mg by mouth at bedtime.    11/27/2019 at Unknown time  .  esomeprazole (NEXIUM) 20 MG capsule Take 20 mg by mouth at bedtime.    11/26/2019  . gabapentin (NEURONTIN) 300 MG capsule Take 900 mg by mouth 2 (two) times daily as needed (pain).    11/27/2019 at Unknown time  . hydrALAZINE (APRESOLINE) 25 MG tablet Take 25 mg by mouth 3 (three) times daily as needed (for a B/P >180/100).   11/27/2019 at Unknown time  . lisinopril (ZESTRIL) 40 MG tablet Take 40 mg by mouth at bedtime.   11/27/2019 at Unknown time  . metoprolol tartrate (LOPRESSOR) 25 MG tablet Take 25 mg by mouth 2 (two) times daily.   11/28/2019 at 0430  . NOVOLIN R RELION 100 UNIT/ML injection For use in pump, for a total of 240 units per day. 80 mL 11 11/27/2019 at 0800  . Continuous Blood Gluc Sensor (DEXCOM G6 SENSOR) MISC 1 each by Does not apply route See admin instructions. For use with continuous glucose monitoring system. Change sensor every 10 days; E11.9 3 each 2 11/27/2019 at 0800  . Insulin Infusion Pump Supplies (PARADIGM RESERVOIR 3ML) MISC 1 Device by Does not apply route daily. 90 each 3 supply  . Insulin Infusion Pump Supplies (QUICK-SET INFUSION 43" 9MM) MISC 1 Device by Does not apply route daily. 90 each 3 supply  . lisinopril (PRINIVIL,ZESTRIL) 30 MG  tablet Take 1 tablet (30 mg total) by mouth at bedtime. (Patient not taking: Reported on 11/16/2019) 30 tablet 0 Not Taking at Unknown time     Eudelia Bunch, Pharm.D [phone] 11/28/2019 11:13 AM

## 2019-11-29 LAB — CBC WITH DIFFERENTIAL/PLATELET
Abs Immature Granulocytes: 0.12 10*3/uL — ABNORMAL HIGH (ref 0.00–0.07)
Basophils Absolute: 0 10*3/uL (ref 0.0–0.1)
Basophils Relative: 0 %
Eosinophils Absolute: 0 10*3/uL (ref 0.0–0.5)
Eosinophils Relative: 0 %
HCT: 42.9 % (ref 39.0–52.0)
Hemoglobin: 14.1 g/dL (ref 13.0–17.0)
Immature Granulocytes: 1 %
Lymphocytes Relative: 8 %
Lymphs Abs: 1.2 10*3/uL (ref 0.7–4.0)
MCH: 27.9 pg (ref 26.0–34.0)
MCHC: 32.9 g/dL (ref 30.0–36.0)
MCV: 85 fL (ref 80.0–100.0)
Monocytes Absolute: 1.4 10*3/uL — ABNORMAL HIGH (ref 0.1–1.0)
Monocytes Relative: 9 %
Neutro Abs: 13.6 10*3/uL — ABNORMAL HIGH (ref 1.7–7.7)
Neutrophils Relative %: 82 %
Platelets: 144 10*3/uL — ABNORMAL LOW (ref 150–400)
RBC: 5.05 MIL/uL (ref 4.22–5.81)
RDW: 13.8 % (ref 11.5–15.5)
WBC: 16.4 10*3/uL — ABNORMAL HIGH (ref 4.0–10.5)
nRBC: 0 % (ref 0.0–0.2)

## 2019-11-29 LAB — COMPREHENSIVE METABOLIC PANEL
ALT: 29 U/L (ref 0–44)
AST: 25 U/L (ref 15–41)
Albumin: 3.8 g/dL (ref 3.5–5.0)
Alkaline Phosphatase: 115 U/L (ref 38–126)
Anion gap: 11 (ref 5–15)
BUN: 20 mg/dL (ref 8–23)
CO2: 23 mmol/L (ref 22–32)
Calcium: 8.7 mg/dL — ABNORMAL LOW (ref 8.9–10.3)
Chloride: 101 mmol/L (ref 98–111)
Creatinine, Ser: 0.94 mg/dL (ref 0.61–1.24)
GFR calc Af Amer: >60 mL/min (ref >60)
GFR calc non Af Amer: >60 mL/min (ref >60)
Glucose, Bld: 247 mg/dL — ABNORMAL HIGH (ref 70–99)
Potassium: 4.1 mmol/L (ref 3.5–5.1)
Sodium: 135 mmol/L (ref 135–145)
Total Bilirubin: 0.8 mg/dL (ref 0.3–1.2)
Total Protein: 6.8 g/dL (ref 6.5–8.1)

## 2019-11-29 LAB — GLUCOSE, CAPILLARY
Glucose-Capillary: 263 mg/dL — ABNORMAL HIGH (ref 70–99)
Glucose-Capillary: 190 mg/dL — ABNORMAL HIGH (ref 70–99)

## 2019-11-29 MED ORDER — ENOXAPARIN SODIUM 40 MG/0.4ML ~~LOC~~ SOLN: 40.0000 mg | SUBCUTANEOUS | 0 refills | Status: DC

## 2019-11-29 MED ORDER — ONDANSETRON 4 MG PO TBDP: 4.0000 mg | ORAL_TABLET | Freq: Four times a day (QID) | ORAL | 0 refills | Status: DC | PRN

## 2019-11-29 MED ORDER — GABAPENTIN 100 MG PO CAPS: 200.0000 mg | ORAL_CAPSULE | Freq: Two times a day (BID) | ORAL | 0 refills | Status: DC

## 2019-11-29 NOTE — Progress Notes (Signed)
D/C instructions given by Bariatric nurse. Patient has no questions. NT or writer will wheel patient out once family comes to pick him up

## 2019-11-29 NOTE — Progress Notes (Signed)
Inpatient Diabetes Program Recommendations  AACE/ADA: New Consensus Statement on Inpatient Glycemic Control (2015)  Target Ranges:  Prepandial:   less than 140 mg/dL      Peak postprandial:   less than 180 mg/dL (1-2 hours)      Critically ill patients:  140 - 180 mg/dL   Lab Results  Component Value Date   GLUCAP 190 (H) 11/29/2019   HGBA1C 8.7 (H) 11/24/2019    Review of Glycemic Control  Diabetes history: DM2 Outpatient Diabetes medications: Insulin pump Current orders for Inpatient glycemic control: Novolog 0-15 units Q4H  HgbA1C - 8.7%  Spoke with pt on 4/12 regarding his diabetes control and insulin pump. Pt has Dexcom and Endo had given him instructions to decrease basal rate by 20% after surgery. Pt was able to do this.  Inpatient Diabetes Program Recommendations:     Insulin Pump Order Set D/C Novolog 0-15 units Q4H. Allow pt to manage his own pump while inpatient.  Will contact RN.  Thank you. Lorenda Peck, RD, LDN, CDE Inpatient Diabetes Coordinator [phone]

## 2019-11-29 NOTE — Progress Notes (Signed)
Patient alert and oriented, Post op day 1.  Provided support and encouragement.  Encouraged pulmonary toilet, ambulation and small sips of liquids.  Completed 12 ounces of bari clear fluid and 2 ounces of protein.  All questions answered.  Will continue to monitor.

## 2019-11-29 NOTE — Progress Notes (Signed)
Pt had finger stick order, checked CBG it was 344,RN came to pt room to give his insuline. Pt stated " I already had my insuline,I have my own insuline pump." so RN educated to pt we are going to check your CBG and give insuline according to MD order. Pt said "I know my body,I have been taking insuline for so long so I know what to do"  . Even though pt was informed we will give his insuline according to order,pt still checking his CBG  and administered insuline by him self.

## 2019-11-29 NOTE — Progress Notes (Signed)
Patient alert and oriented, pain is controlled. Patient is tolerating fluids, advanced to protein shake today, patient is tolerating well.  Reviewed Gastric sleeve discharge instructions with patient and patient is able to articulate understanding.  Provided information on BELT program, Support Group and [facility] outpatient pharmacy. Lovenox education completed, teaching kit provided to patient.  All questions answered, will continue to monitor.  Total fluid intake 890 Per dehydration protocol call back one week post op

## 2019-11-29 NOTE — Discharge Summary (Signed)
Physician Discharge Summary  Jesse Guerrero [id number] DOB: 1951/06/23 DOA: 11/28/2019  PCP: Dwain Sarna, MD  Admit date: 11/28/2019 Discharge date: 11/29/2019  Recommendations for Outpatient Follow-up:  1.  (include homehealth, outpatient follow-up instructions, specific recommendations for PCP to follow-up on, etc.)  Follow-up Information    , Arta Bruce, MD. Go on 12/23/2019.   Specialty: General Surgery Why: at 1110 am.  Please arrive 15 minutes prior to appointment time.  Thank  you Contact information: [place] [address] [phone]        Surgery, [place]. Go on 01/18/2020.   Specialty: General Surgery Why: at 1050 am.  Please arrive 15 minutes prior to appointment time.  Thank you. Contact information: [place] [address] [phone]          Discharge Diagnoses:  Active Problems:   Morbid obesity ([place])   Surgical Procedure: Roux-en-Y gastric bypass, upper endoscopy  Discharge Condition: Good Disposition: Home  Diet recommendation: Postoperative sleeve gastrectomy diet (liquids only)  Filed Weights   11/28/19 0533 11/28/19 0549  Weight: (!) 137.9 kg 135.2 kg     Hospital Course:  The patient was admitted after undergoing Roux-en-Y gastric bypass. POD 0 he ambulated well. POD 1 he was started on the water diet protocol and tolerated 500 ml in the first shift. He had hyperglycemia and reapplied his pump for appropriate dosing and did not take the SSI. Once meeting the water amount he was advanced to bariatric protein shakes which they tolerated and were discharged home POD 1.  Treatments: surgery: Roux-en-Y gastric bypass  Discharge Instructions  Discharge Instructions    Ambulate hourly while awake   Complete by: As directed    Call MD for:  difficulty breathing, headache or visual disturbances   Complete by: As directed    Call MD for:  persistant dizziness or  light-headedness   Complete by: As directed    Call MD for:  persistant nausea and vomiting   Complete by: As directed    Call MD for:  redness, tenderness, or signs of infection (pain, swelling, redness, odor or green/yellow discharge around incision site)   Complete by: As directed    Call MD for:  severe uncontrolled pain   Complete by: As directed    Call MD for:  temperature >101 F   Complete by: As directed    Diet bariatric full liquid   Complete by: As directed    Discharge wound care:   Complete by: As directed    Remove Bandaids tomorrow, ok to shower tomorrow. Steristrips may fall off in 1-3 weeks.   Incentive spirometry   Complete by: As directed    Perform hourly while awake     Allergies as of 11/29/2019   No Known Allergies     Medication List    TAKE these medications   amLODipine 5 MG tablet Commonly known as: NORVASC Take 5 mg by mouth at bedtime. Notes to patient: Monitor Blood Pressure Daily and keep a log for primary care physician.  You may need to make changes to your medications with rapid weight loss.     aspirin EC 81 MG tablet Take 81 mg by mouth at bedtime.   atorvastatin 40 MG tablet Commonly known as: LIPITOR Take 40 mg by mouth at bedtime.   Dexcom G6 Sensor Misc 1 each by Does not apply route See admin instructions. For use with continuous glucose monitoring system. Change sensor  every 10 days; E11.9   enoxaparin 40 MG/0.4ML injection Commonly known as: LOVENOX Inject 0.4 mLs (40 mg total) into the skin daily for 14 doses.   esomeprazole 20 MG capsule Commonly known as: NEXIUM Take 20 mg by mouth at bedtime.   gabapentin 300 MG capsule Commonly known as: NEURONTIN Take 900 mg by mouth 2 (two) times daily as needed (pain). What changed: Another medication with the same name was added. Make sure you understand how and when to take each.   gabapentin 100 MG capsule Commonly known as: NEURONTIN Take 2 capsules (200 mg total) by mouth  every 12 (twelve) hours. What changed: You were already taking a medication with the same name, and this prescription was added. Make sure you understand how and when to take each.   hydrALAZINE 25 MG tablet Commonly known as: APRESOLINE Take 25 mg by mouth 3 (three) times daily as needed (for a B/P >180/100). Notes to patient: Monitor Blood Pressure Daily and keep a log for primary care physician.  You may need to make changes to your medications with rapid weight loss.     lisinopril 40 MG tablet Commonly known as: ZESTRIL Take 40 mg by mouth at bedtime. Notes to patient: Monitor Blood Pressure Daily and keep a log for primary care physician.  You may need to make changes to your medications with rapid weight loss.     lisinopril 30 MG tablet Commonly known as: ZESTRIL Take 1 tablet (30 mg total) by mouth at bedtime. Notes to patient: Monitor Blood Pressure Daily and keep a log for primary care physician.  You may need to make changes to your medications with rapid weight loss.     metoprolol tartrate 25 MG tablet Commonly known as: LOPRESSOR Take 25 mg by mouth 2 (two) times daily. Notes to patient: Monitor Blood Pressure Daily and keep a log for primary care physician.  You may need to make changes to your medications with rapid weight loss.     NovoLIN R ReliOn 100 units/mL injection Generic drug: insulin regular For use in pump, for a total of 240 units per day. Notes to patient: Monitor Blood Sugar Frequently and keep a log for primary care physician, you may need to adjust medication dosage with rapid weight loss.     ondansetron 4 MG disintegrating tablet Commonly known as: ZOFRAN-ODT Take 1 tablet (4 mg total) by mouth every 6 (six) hours as needed for nausea or vomiting.   Quick-set Infusion 43" 6mm Misc 1 Device by Does not apply route daily.   PARADIGM RESERVOIR 3ML Misc 1 Device by Does not apply route daily.            Discharge Care Instructions  (From  admission, onward)         Start     Ordered   11/29/19 0000  Discharge wound care:    Comments: Remove Bandaids tomorrow, ok to shower tomorrow. Steristrips may fall off in 1-3 weeks.   11/29/19 0915         Follow-up Information    , Arta Bruce, MD. Go on 12/23/2019.   Specialty: General Surgery Why: at 1110 am.  Please arrive 15 minutes prior to appointment time.  Thank  you Contact information: [place] [address] [phone]        Surgery, [facility]. Go on 01/18/2020.   Specialty: General Surgery Why: at 1050 am.  Please arrive 15 minutes prior to appointment time.  Thank you. Contact information: [place]  [place] [address] [phone]            The results of significant diagnostics from this hospitalization (including imaging, microbiology, ancillary and laboratory) are listed below for reference.    Significant Diagnostic Studies: No results found.  Labs: Basic Metabolic Panel: Recent Labs  Lab 11/24/19 0911 11/29/19 0438  NA 136 135  K 4.1 4.1  CL 106 101  CO2 25 23  GLUCOSE 221* 247*  BUN 13 20  CREATININE 0.74 0.94  CALCIUM 8.7* 8.7*   Liver Function Tests: Recent Labs  Lab 11/29/19 0438  AST 25  ALT 29  ALKPHOS 115  BILITOT 0.8  PROT 6.8  ALBUMIN 3.8    CBC: Recent Labs  Lab 11/24/19 0911 11/28/19 1150 11/29/19 0438  WBC 8.6  --  16.4*  NEUTROABS  --   --  13.6*  HGB 15.0 16.2 14.1  HCT 46.8 49.4 42.9  MCV 87.0  --  85.0  PLT 126*  --  144*    CBG: Recent Labs  Lab 11/28/19 1700 11/28/19 2002 11/28/19 2349 11/29/19 0356 11/29/19 0736  GLUCAP 321* 344* 271* 263* 190*    Active Problems:   Morbid obesity ([place])   VTE plan: I will prescribe outpatient chemical prophylaxis of enoxaparin due to this increased risk ([email or web address])  Time coordinating discharge: 15 min

## 2019-12-05 ENCOUNTER — Telehealth (HOSPITAL_COMMUNITY): Payer: Self-pay

## 2019-12-05 NOTE — Telephone Encounter (Addendum)
Patient called to discuss post bariatric surgery follow up questions.  Left detailed voicemail with contact information provided for return phone call.  Await response from patient.  Call returned 12/05/2019 1705.   1.  Tell me about your pain and pain management? denies any pain, states surgeon did a great job there  2.  Let's talk about fluid intake.  How much total fluid are you taking in? 70 ounces only taking in protein and water,  Encouraged other fluids  3.  How much protein have you taken in the last 2 days? 60 grams, strategies to increase protein discussed  4.  Have you had nausea?  Tell me about when have experienced nausea and what you did to help?denies  5.  Has the frequency or color changed with your urine? Making urine no problem  6.  Tell me what your incisions look like? No problems  7.  Have you been passing gas? BM?had bm since surgery  8.  If a problem or question were to arise who would you call?  Do you know contact numbers for [place], [facility], and [facility]?aware of how to contact services, has reached out to [facility] regarding mvi and calcium  9.  How has the walking going? Walking on treadmill, encouraged to build up activity as tolerated  10.  How are your vitamins and calcium going?  How are you taking them?having trouble with vitamins the texture recommendations for other vitamins, calcium not going well either we discussed using liquid calcium.  Major issue patient does not have all teeth makes chewing mvi and calcium difficult.   Had some elevated blood pressures, taking medication.  Instructed to reach out to PCP for guidance and schedule follow up appointment with PCP.  Was very pleased states has lost 20 pounds a little tired but feels good.

## 2019-12-13 ENCOUNTER — Other Ambulatory Visit: Payer: Self-pay

## 2019-12-13 ENCOUNTER — Encounter: Payer: Medicare Other | Attending: Internal Medicine | Admitting: Skilled Nursing Facility1

## 2019-12-13 DIAGNOSIS — E1165 Type 2 diabetes mellitus with hyperglycemia: Secondary | ICD-10-CM | POA: Insufficient documentation

## 2019-12-13 DIAGNOSIS — Z794 Long term (current) use of insulin: Secondary | ICD-10-CM | POA: Diagnosis present

## 2019-12-13 DIAGNOSIS — E1142 Type 2 diabetes mellitus with diabetic polyneuropathy: Secondary | ICD-10-CM

## 2019-12-13 NOTE — Progress Notes (Signed)
2 Week Post-Operative Nutrition Class   Patient was seen on 10/12/18 for Post-Operative Nutrition education at the [facility].    Pt states he removed his insulin pump and his sugars run around 140.   Surgery date: 11/28/2019 Surgery type: RYGB Start weight at [facility]: 289 Weight today: 271   Body Composition Scale Pt declined  Total Body Fat %   Visceral Fat   Fat-Free Mass %    Total Body Water %    Muscle-Mass lbs   Body Fat Displacement          Torso  lbs          Left Leg  lbs          Right Leg  lbs          Left Arm  lbs          Right Arm   lbs      The following the learning objectives were met by the patient during this course:  Identifies Phase 3 (Soft, High Proteins) Dietary Goals and will begin from 2 weeks post-operatively to 2 months post-operatively  Identifies appropriate sources of fluids and proteins   States protein recommendations and appropriate sources post-operatively  Identifies the need for appropriate texture modifications, mastication, and bite sizes when consuming solids  Identifies appropriate multivitamin and calcium sources post-operatively  Describes the need for physical activity post-operatively and will follow MD recommendations  States when to call healthcare provider regarding medication questions or post-operative complications   Handouts given during class include:  Phase 3A: Soft, High Protein Diet Handout   Follow-Up Plan: Patient will follow-up at [facility] in 6 weeks for 2 month post-op nutrition visit for diet advancement per MD.

## 2019-12-19 ENCOUNTER — Telehealth: Payer: Self-pay | Admitting: Skilled Nursing Facility1

## 2019-12-19 NOTE — Telephone Encounter (Signed)
RD called pt to verify fluid intake once starting soft, solid proteins 2 week post-bariatric surgery.   Daily Fluid intake: 64+ Daily Protein intake: 80+  Concerns/issues:   No concerns stated. Pt states he is happy with his weight loss.

## 2020-01-25 ENCOUNTER — Encounter: Payer: Medicare Other | Attending: Internal Medicine | Admitting: Skilled Nursing Facility1

## 2020-01-25 ENCOUNTER — Other Ambulatory Visit: Payer: Self-pay

## 2020-01-25 DIAGNOSIS — E1165 Type 2 diabetes mellitus with hyperglycemia: Secondary | ICD-10-CM | POA: Diagnosis present

## 2020-01-25 DIAGNOSIS — Z794 Long term (current) use of insulin: Secondary | ICD-10-CM | POA: Diagnosis present

## 2020-01-25 NOTE — Progress Notes (Signed)
Bariatric Nutrition Follow-Up Visit Medical Nutrition Therapy   NUTRITION ASSESSMENT    Anthropometrics  Surgery date: 11/28/2019 Surgery type: RYGB Start weight at [facility]: 289 Weight today: 247.7  Body Composition Scale Declined  Weight  lbs   Total Body Fat  %      Visceral Fat   Fat-Free Mass  %      Total Body Water  %      Muscle-Mass  lbs   BMI   Body Fat Displacement ---        Torso  lbs         Left Leg  lbs         Right Leg  lbs         Left Arm  lbs         Right Arm  lbs    Clinical  Medical hx: DM, hypertension  Medications:  Labs:    Lifestyle & Dietary Hx  Pt states he is happy with his weight. Pt states he "cheated" with a  Dark chocolate bar. Pt states his blood sugars run around 110. Pt states he cannot digest celery. Pt states he eats a few bites and then throws up (pt admits to this happening once and when he drinks with his meals). Pt states he has tried salads stating it does not go well: Dietitian advised that is why there is a specific progression in the diet meaning it was too soon but it will get better by eating well cooked vegetables until tolerated. Pt states he does not like non starchy vegetables: Dietitian advised pt no eating non stacrhy vegetables is not conducive to a healthy body with the possibility of having deficiencies. Pt asked if a "vegetable capsule" would be enough to not eat non starchy vegetables: Dietitian advised there is no substitute to eating th whole vegetable.   Estimated daily fluid intake: 80 oz Estimated daily protein intake: 80+ g Supplements: bari multi and calcium (2 times a day fluid) Current average weekly physical activity: swimming 1 mile a day and walking 3 miles and playing golf 3 times a week  24-Hr Dietary Recall First Meal: protein shake Snack: sugar free peanut butter (big spoon) Second Meal: cheese + crackers Snack:  Third Meal: chicken or ham Snack:  Beverages: water, protein shake  Post-Op Goals/  Signs/ Symptoms Using straws: no Drinking while eating: no Chewing/swallowing difficulties: no Changes in vision: no Changes to mood/headaches: no Hair loss/changes to skin/nails: no Difficulty focusing/concentrating: no Sweating: no Dizziness/lightheadedness: no Palpitations: no  Carbonated/caffeinated beverages: no N/V/D/C/Gas: yes constipation and taking miralax and stool softeners  Abdominal pain: no Dumping syndrome: no    NUTRITION DIAGNOSIS  Overweight/obesity ([place]-3.3) related to past poor dietary habits and physical inactivity as evidenced by completed bariatric surgery and following dietary guidelines for continued weight loss and healthy nutrition status.     NUTRITION INTERVENTION Nutrition counseling (C-1) and education (E-2) to facilitate bariatric surgery goals, including: . Diet advancement to the next phase (phase 4) now including non starchy vegetables  . The importance of consuming adequate calories as well as certain nutrients daily due to the body's need for essential vitamins, minerals, and fats . The importance of daily physical activity and to reach a goal of at least 150 minutes of moderate to vigorous physical activity weekly (or as directed by their physician) due to benefits such as increased musculature and improved lab values . The importance of intuitive eating specifically learning hunger-satiety cues and understanding  the importance of learning a new body  Goals: -Continue to aim for a minimum of 64 fluid ounces 7 days a week with at least 30 ounces being plain water -Eat non-starchy vegetables 2 times a day 7 days a week -Start out with soft cooked vegetables today and tomorrow; if tolerated begin to eat raw vegetables or cooked including salads -Eat your 3 ounces of protein first then start in on your non-starchy vegetables; once you understand how much of your meal leads to satisfaction and not full while still eating 3 ounces of protein and  non-starchy vegetables you can eat them in any order  -Continue to aim for 30 minutes of activity at least 5 times a week -Do NOT cook with/add to your food: alfredo sauce, cheese sauce, barbeque sauce, ketchup, fat back, butter, bacon grease, grease, Crisco, OR SUGAR -Do not drink with meals For constipation: beans 3+ times a week and non stachry vegetables 2 times a day 7 days a week  Handouts Provided Include   Phase 4  Learning Style & Readiness for Change Teaching method utilized: Visual & Auditory  Demonstrated degree of understanding via: Teach Back  Barriers to learning/adherence to lifestyle change: unknown at this time   RD's Notes for Next Visit . Assess adherence to pt chosen goals   MONITORING & EVALUATION Dietary intake, weekly physical activity, body weight  Next Steps Patient is to follow-up in 3 months

## 2020-03-12 ENCOUNTER — Telehealth: Payer: Self-pay

## 2020-03-12 NOTE — Telephone Encounter (Signed)
Received request from [place] to complete orders for Dexom as well as provide updated office notes and Dexom logs. Pt requires an appt in order to satisfy their request. My Chart message has been sent to pt advising him to call and schedule appt. ALL REMAINS ON HOLD PENDING PT SCHEDULING AND ATTENDING APPT.

## 2020-04-24 ENCOUNTER — Ambulatory Visit: Payer: Medicare Other | Admitting: Dietician

## 2020-08-27 LAB — HM DIABETES EYE EXAM

## 2021-06-20 ENCOUNTER — Encounter (HOSPITAL_COMMUNITY): Payer: Self-pay | Admitting: *Deleted

## 2021-10-16 IMAGING — RF DG UGI W SINGLE CM
12 of 19 series · 12 of 24 positions shown · non-contrast
Comparison: None.

CLINICAL DATA: Morbid obesity, preoperative for bariatric surgery.

EXAM:
UPPER GI SERIES WITH KUB
TECHNIQUE: After obtaining a scout radiograph a routine upper GI series was
performed using thin barium
FLUOROSCOPY TIME:  Fluoroscopy Time:  2 minutes, 54 seconds
Radiation Exposure Index (if provided by the fluoroscopic device):
76.6 mGy
Number of Acquired Spot Images: 0

[Series 3: cp_standard · 0.35mm/px · 1 of 31 frames shown (1 of 11)]
[frame 16/31]
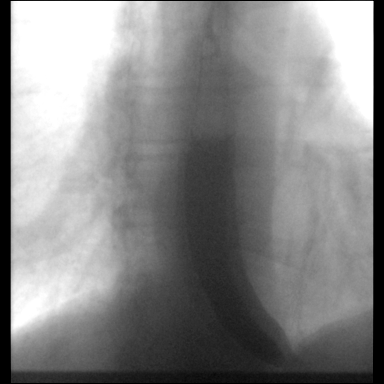

[Series 5: cp_standard · 0.35mm/px · 1 of 28 frames shown (2 of 11)]
[frame 5/28]
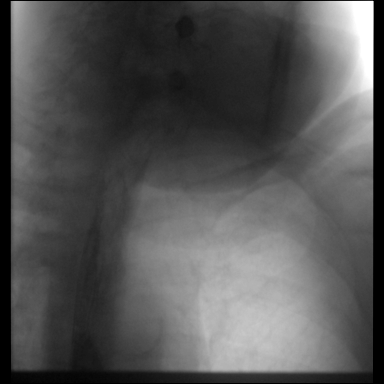

[Series 6: cp_standard · 0.35mm/px · 1 of 51 frames shown (3 of 11)]
[frame 44/51]
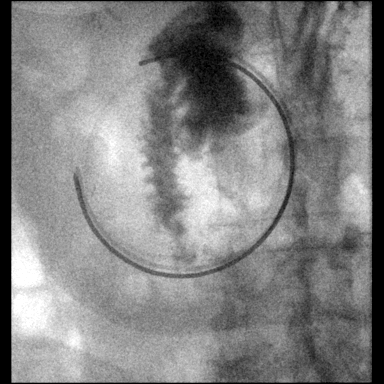

[Series 9: fluoro_barium 2fps_bw · 0.18mm/px · 1 of 1 slices shown]
[im 1/1]
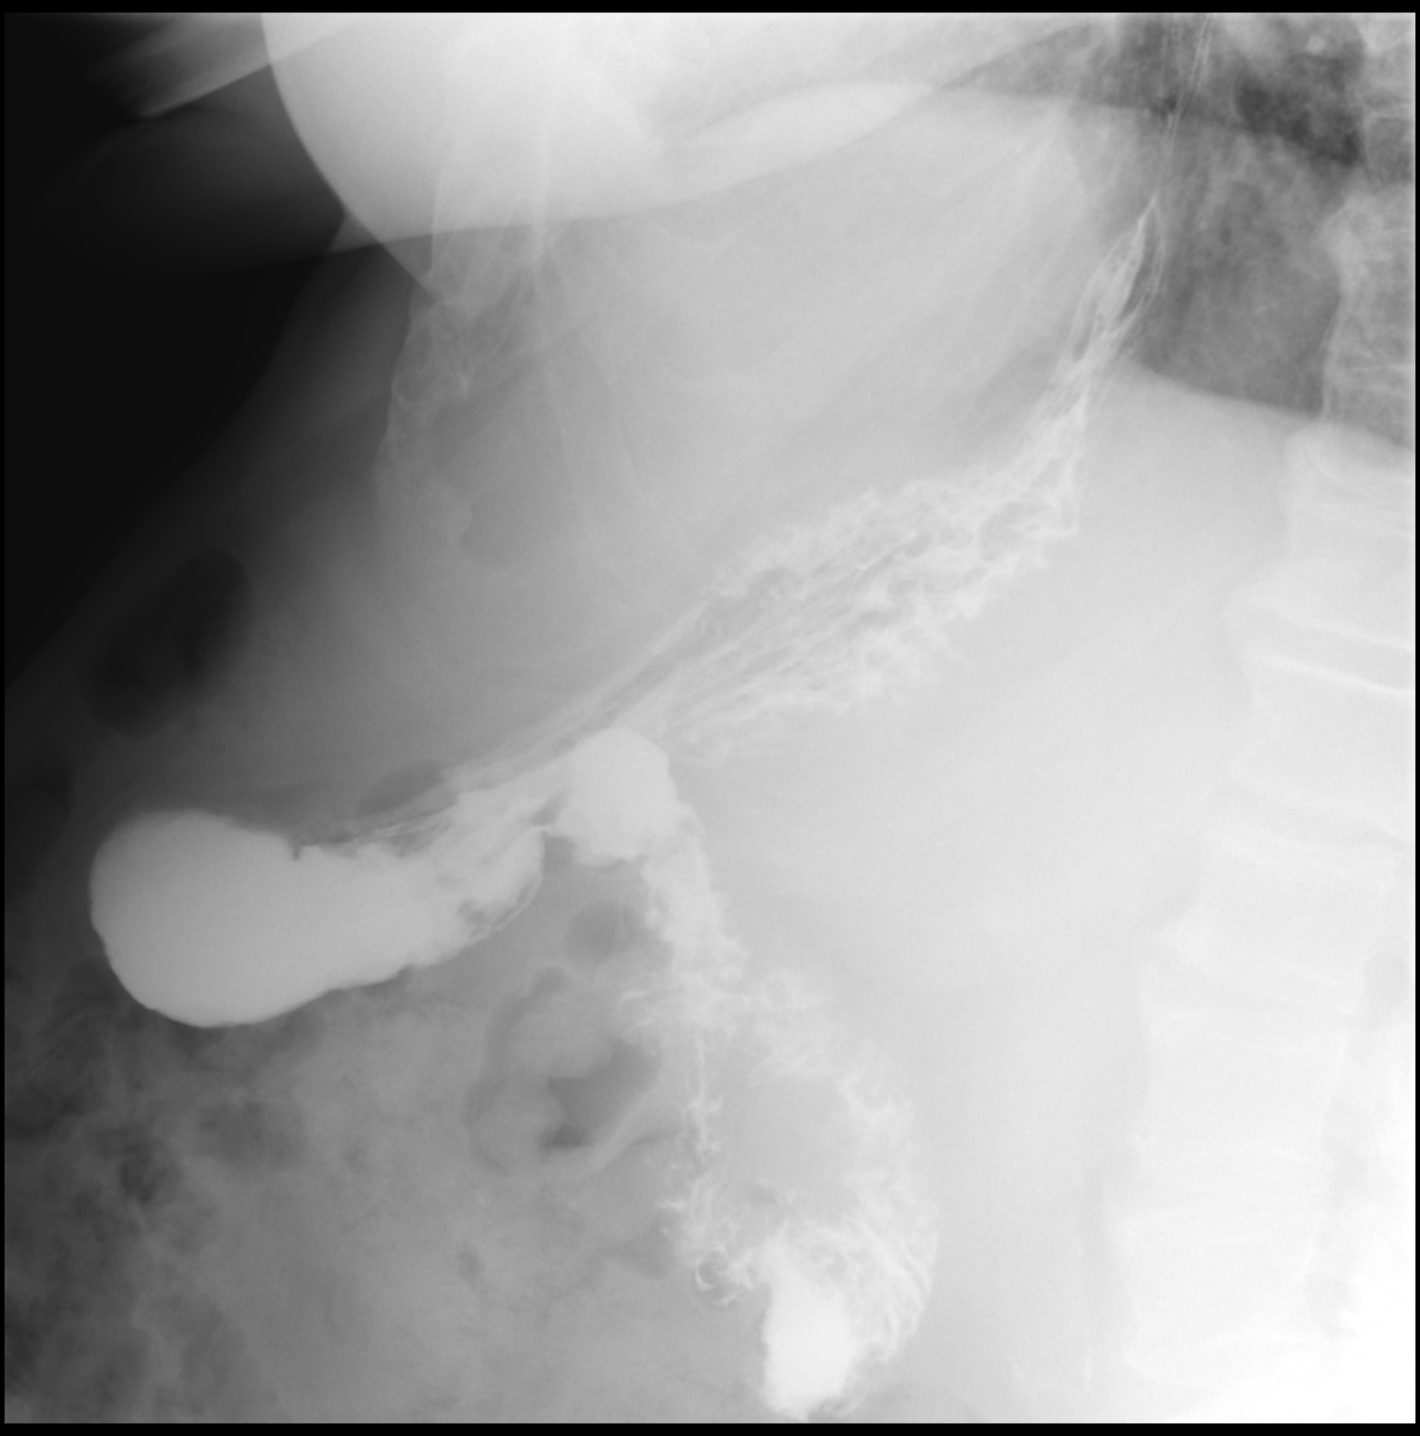

[Series 12: cp_standard · 0.35mm/px · 1 of 44 frames shown (4 of 11)]
[frame 7/44]
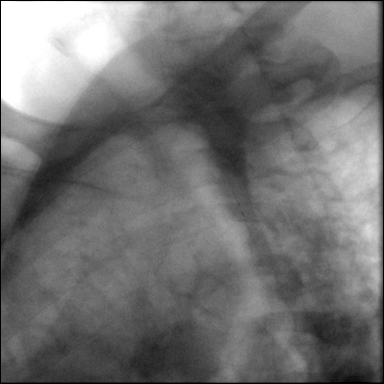

[Series 13: cp_standard · 0.35mm/px · 1 of 40 frames shown (5 of 11)]
[frame 35/40]
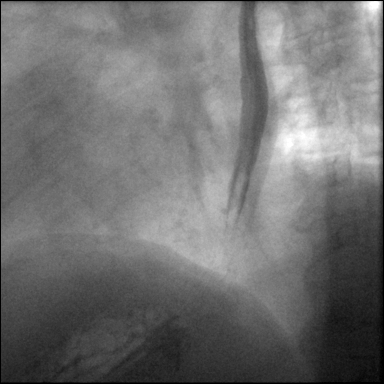

[Series 15: cp_standard · 0.35mm/px · 1 of 23 frames shown (6 of 11)]
[frame 3/23]
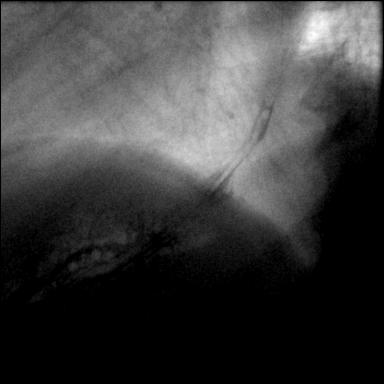

[Series 16: cp_standard · 0.35mm/px · 1 of 15 frames shown (7 of 11)]
[frame 8/15]
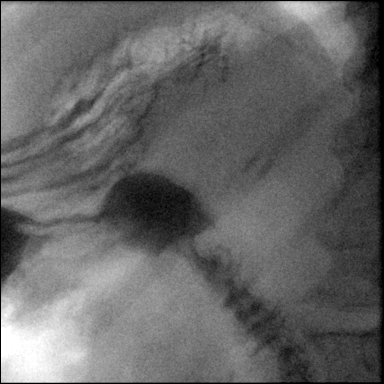

[Series 18: cp_standard · 0.35mm/px · 1 of 11 frames shown (8 of 11)]
[frame 2/11]
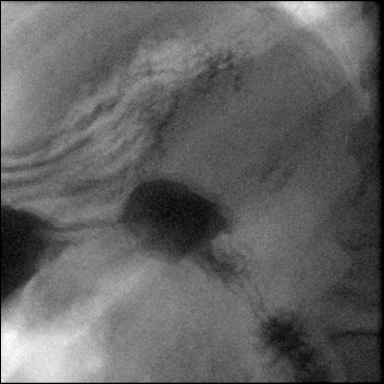

[Series 19: cp_standard · 0.35mm/px · 1 of 12 frames shown (9 of 11)]
[frame 11/12]
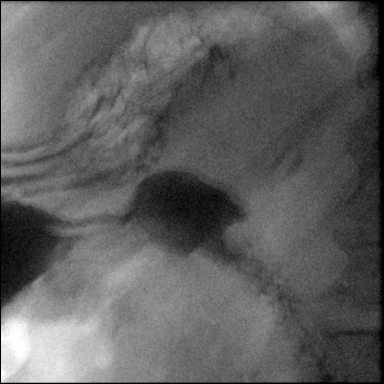

[Series 21: cp_standard · 0.35mm/px · 1 of 13 frames shown (10 of 11)]
[frame 7/13]
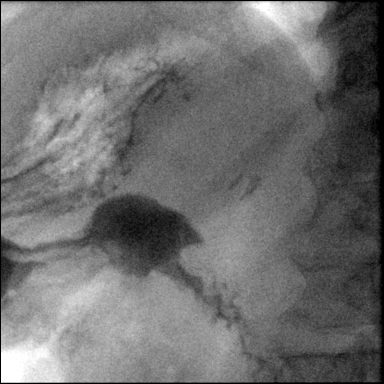

[Series 22: cp_standard · 0.35mm/px · 1 of 23 frames shown (11 of 11)]
[frame 20/23]
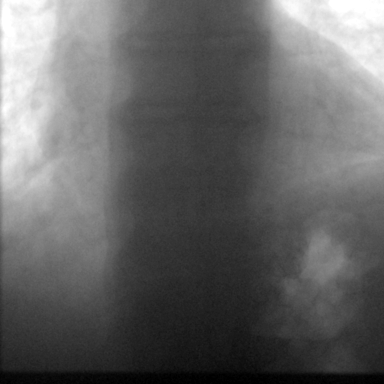

[12 of 24 positions shown; findings below may reference images not displayed]

FINDINGS: Initial KUB demonstrates cholecystectomy clips along with
thoracolumbar spondylosis.

No narrowings or strictures along the esophagus. Gastric morphology
normal. Mild prominence of size of the duodenal bulb with normal
duodenal configuration.

There is disruption of primary peristaltic waves in the esophagus on
[DATE] swallows suggesting mild nonspecific esophageal dysmotility
disorder.

A barium tablet passed into the stomach briskly with patient in the
upright position.
IMPRESSION: 1. Mild nonspecific esophageal dysmotility disorder.
2. No narrowings or strictures along the esophagus.
3. Mild prominence of the duodenal bulb, no definite ulceration
identified.

## 2022-06-11 ENCOUNTER — Encounter: Payer: Self-pay | Admitting: Internal Medicine

## 2022-06-27 ENCOUNTER — Encounter (HOSPITAL_COMMUNITY): Payer: Self-pay | Admitting: *Deleted

## 2023-01-18 ENCOUNTER — Observation Stay (HOSPITAL_COMMUNITY)
Admission: EM | Admit: 2023-01-18 | Discharge: 2023-01-19 | Disposition: A | Discharge status: Home / Self Care | Payer: Medicare HMO | Attending: Internal Medicine | Admitting: Internal Medicine

## 2023-01-18 ENCOUNTER — Encounter (HOSPITAL_COMMUNITY): Payer: Self-pay

## 2023-01-18 ENCOUNTER — Other Ambulatory Visit: Payer: Self-pay

## 2023-01-18 ENCOUNTER — Emergency Department (HOSPITAL_COMMUNITY): Payer: Medicare HMO

## 2023-01-18 DIAGNOSIS — W010XXA Fall on same level from slipping, tripping and stumbling without subsequent striking against object, initial encounter: Secondary | ICD-10-CM | POA: Diagnosis not present

## 2023-01-18 DIAGNOSIS — Z79899 Other long term (current) drug therapy: Secondary | ICD-10-CM | POA: Diagnosis not present

## 2023-01-18 DIAGNOSIS — R001 Bradycardia, unspecified: Secondary | ICD-10-CM | POA: Insufficient documentation

## 2023-01-18 DIAGNOSIS — S32591A Other specified fracture of right pubis, initial encounter for closed fracture: Secondary | ICD-10-CM | POA: Diagnosis present

## 2023-01-18 DIAGNOSIS — I10 Essential (primary) hypertension: Secondary | ICD-10-CM | POA: Diagnosis not present

## 2023-01-18 DIAGNOSIS — Z87891 Personal history of nicotine dependence: Secondary | ICD-10-CM | POA: Diagnosis not present

## 2023-01-18 DIAGNOSIS — Z794 Long term (current) use of insulin: Secondary | ICD-10-CM | POA: Diagnosis not present

## 2023-01-18 DIAGNOSIS — S32511A Fracture of superior rim of right pubis, initial encounter for closed fracture: Principal | ICD-10-CM | POA: Insufficient documentation

## 2023-01-18 DIAGNOSIS — Z7984 Long term (current) use of oral hypoglycemic drugs: Secondary | ICD-10-CM | POA: Insufficient documentation

## 2023-01-18 DIAGNOSIS — J45909 Unspecified asthma, uncomplicated: Secondary | ICD-10-CM | POA: Insufficient documentation

## 2023-01-18 DIAGNOSIS — E119 Type 2 diabetes mellitus without complications: Secondary | ICD-10-CM | POA: Insufficient documentation

## 2023-01-18 DIAGNOSIS — D696 Thrombocytopenia, unspecified: Secondary | ICD-10-CM | POA: Insufficient documentation

## 2023-01-18 LAB — COMPREHENSIVE METABOLIC PANEL
ALT: 28 U/L (ref 0–44)
AST: 22 U/L (ref 15–41)
Albumin: 3.6 g/dL (ref 3.5–5.0)
Alkaline Phosphatase: 131 U/L — ABNORMAL HIGH (ref 38–126)
Anion gap: 6 (ref 5–15)
BUN: 10 mg/dL (ref 8–23)
CO2: 24 mmol/L (ref 22–32)
Calcium: 8.5 mg/dL — ABNORMAL LOW (ref 8.9–10.3)
Chloride: 106 mmol/L (ref 98–111)
Creatinine, Ser: 0.6 mg/dL — ABNORMAL LOW (ref 0.61–1.24)
GFR, Estimated: >60 mL/min (ref >60)
Glucose, Bld: 172 mg/dL — ABNORMAL HIGH (ref 70–99)
Potassium: 3.6 mmol/L (ref 3.5–5.1)
Sodium: 136 mmol/L (ref 135–145)
Total Bilirubin: 1 mg/dL (ref 0.3–1.2)
Total Protein: 6.5 g/dL (ref 6.5–8.1)

## 2023-01-18 LAB — GLUCOSE, CAPILLARY
Glucose-Capillary: 110 mg/dL — ABNORMAL HIGH (ref 70–99)
Glucose-Capillary: 103 mg/dL — ABNORMAL HIGH (ref 70–99)

## 2023-01-18 LAB — CBC WITH DIFFERENTIAL/PLATELET
Abs Immature Granulocytes: 0.03 10*3/uL (ref 0.00–0.07)
Basophils Absolute: 0 10*3/uL (ref 0.0–0.1)
Basophils Relative: 1 %
Eosinophils Absolute: 0.1 10*3/uL (ref 0.0–0.5)
Eosinophils Relative: 1 %
HCT: 45.9 % (ref 39.0–52.0)
Hemoglobin: 15.9 g/dL (ref 13.0–17.0)
Immature Granulocytes: 0 %
Lymphocytes Relative: 18 %
Lymphs Abs: 1.6 10*3/uL (ref 0.7–4.0)
MCH: 29.4 pg (ref 26.0–34.0)
MCHC: 34.6 g/dL (ref 30.0–36.0)
MCV: 84.8 fL (ref 80.0–100.0)
Monocytes Absolute: 0.9 10*3/uL (ref 0.1–1.0)
Monocytes Relative: 11 %
Neutro Abs: 5.9 10*3/uL (ref 1.7–7.7)
Neutrophils Relative %: 69 %
Platelets: 128 10*3/uL — ABNORMAL LOW (ref 150–400)
RBC: 5.41 MIL/uL (ref 4.22–5.81)
RDW: 12.9 % (ref 11.5–15.5)
WBC: 8.5 10*3/uL (ref 4.0–10.5)
nRBC: 0 % (ref 0.0–0.2)

## 2023-01-18 MED ORDER — ATORVASTATIN CALCIUM 40 MG PO TABS
40.0000 mg | ORAL_TABLET | Freq: Every day | ORAL | Status: DC
  Administered 2023-01-18: 40 mg via ORAL
  Filled 2023-01-18: qty 1

## 2023-01-18 MED ORDER — LISINOPRIL 10 MG PO TABS
40.0000 mg | ORAL_TABLET | Freq: Every day | ORAL | Status: DC
  Administered 2023-01-18: 40 mg via ORAL
  Filled 2023-01-18: qty 4

## 2023-01-18 MED ORDER — ENOXAPARIN SODIUM 40 MG/0.4ML IJ SOSY
40.0000 mg | PREFILLED_SYRINGE | INTRAMUSCULAR | Status: DC
  Administered 2023-01-18: 40 mg via SUBCUTANEOUS
  Filled 2023-01-18: qty 0.4

## 2023-01-18 MED ORDER — AMLODIPINE BESYLATE 5 MG PO TABS
5.0000 mg | ORAL_TABLET | Freq: Every day | ORAL | Status: DC
  Administered 2023-01-18 – 2023-01-19 (×2): 5 mg via ORAL
  Filled 2023-01-18 (×2): qty 1

## 2023-01-18 MED ORDER — OXYCODONE-ACETAMINOPHEN 5-325 MG PO TABS: 1.0000 | ORAL_TABLET | ORAL | Status: DC | PRN

## 2023-01-18 MED ORDER — KETOROLAC TROMETHAMINE 15 MG/ML IJ SOLN
15.0000 mg | Freq: Once | INTRAMUSCULAR | Status: AC
  Administered 2023-01-18: 15 mg via INTRAMUSCULAR
  Filled 2023-01-18: qty 1

## 2023-01-18 MED ORDER — ACETAMINOPHEN 325 MG PO TABS
650.0000 mg | ORAL_TABLET | Freq: Four times a day (QID) | ORAL | Status: DC
  Administered 2023-01-18 – 2023-01-19 (×3): 650 mg via ORAL
  Filled 2023-01-18 (×3): qty 2

## 2023-01-18 MED ORDER — METHOCARBAMOL 500 MG PO TABS: 500.0000 mg | ORAL_TABLET | Freq: Four times a day (QID) | ORAL | Status: DC | PRN

## 2023-01-18 MED ORDER — METHOCARBAMOL 1000 MG/10ML IJ SOLN: 500.0000 mg | Freq: Four times a day (QID) | INTRAVENOUS | Status: DC | PRN

## 2023-01-18 MED ORDER — OXYCODONE HCL 5 MG PO TABS
5.0000 mg | ORAL_TABLET | ORAL | Status: DC | PRN
  Administered 2023-01-18 – 2023-01-19 (×2): 5 mg via ORAL
  Filled 2023-01-18 (×2): qty 1

## 2023-01-18 MED ORDER — MORPHINE SULFATE (PF) 2 MG/ML IV SOLN: 0.5000 mg | INTRAVENOUS | Status: DC | PRN

## 2023-01-18 NOTE — Hospital Course (Signed)
72 year old male with a history of hypertension, hyperlipidemia, diabetes mellitus type 2, peripheral neuropathy, presenting with a mechanical fall.  Patient tripped over a chest when tending to his dogs in the morning of 01/18/2023.  He did not lose consciousness.  He denies hitting his head.  He feels like he landed on his upper buttock and lower back.  The patient had difficulty getting up secondary to pain.  As result, he presented for further evaluation and treatment.  The patient had been in usual state of health without any complaints prior to the mechanical fall.  He denies any prodromal symptoms. Patient denies fevers, chills, headache, chest pain, dyspnea, nausea, vomiting, diarrhea, abdominal pain, dysuria, hematuria, hematochezia, and melena.  In the ED, the patient was afebrile and hemodynamically stable with oxygen saturation 99% room air.  WBC 8.5, hemoglobin 15.9, platelets 128,000.  Sodium 136, potassium 3.6, bicarbonate 24, serum creatinine 0.60.  LFTs unremarkable.  X-ray of the pelvis shows a linear nondisplaced fracture of the right inferior pubic ramus.  There was also a possible additional nondisplaced fracture of the superior pubic ramus on the right.  The patient was unable to bear weight secondary to pain.  As a result he was admitted to the hospital for further evaluation and treatment.  At baseline, the patient lives by himself.  He is completely independent with all his ADLs prior to the current injury.

## 2023-01-18 NOTE — ED Triage Notes (Signed)
Pt fell straight back onto his back last night. Pt c/o right leg and groin pain. No head injury or n/v noted

## 2023-01-18 NOTE — H&P (Addendum)
History and Physical    Patient: Jesse Guerrero ZOX:[id number] DOB: March 23, 1951 DOA: 01/18/2023 DOS: the patient was seen and examined on 01/18/2023 PCP: [name], MD  Patient coming from: Home  Chief Complaint:  Chief Complaint  Patient presents with   Fall   HPI: Jesse Guerrero is a 72 year old male with a history of hypertension, hyperlipidemia, diabetes mellitus type 2, peripheral neuropathy, presenting with a mechanical fall.  Patient tripped over a chest when tending to his dogs in the morning of 01/18/2023.  He did not lose consciousness.  He denies hitting his head.  He feels like he landed on his upper buttock and lower back.  The patient had difficulty getting up secondary to pain.  As result, he presented for further evaluation and treatment.  The patient had been in usual state of health without any complaints prior to the mechanical fall.  He denies any prodromal symptoms. Patient denies fevers, chills, headache, chest pain, dyspnea, nausea, vomiting, diarrhea, abdominal pain, dysuria, hematuria, hematochezia, and melena.  In the ED, the patient was afebrile and hemodynamically stable with oxygen saturation 99% room air.  WBC 8.5, hemoglobin 15.9, platelets 128,000.  Sodium 136, potassium 3.6, bicarbonate 24, serum creatinine 0.60.  LFTs unremarkable.  X-ray of the pelvis shows a linear nondisplaced fracture of the right inferior pubic ramus.  There was also a possible additional nondisplaced fracture of the superior pubic ramus on the right.  The patient was unable to bear weight secondary to pain.  As a result he was admitted to the hospital for further evaluation and treatment.  At baseline, the patient lives by himself.  He is completely independent with all his ADLs prior to the current injury.  Review of Systems: As mentioned in the history of present illness. All other systems reviewed and are negative. Past Medical History:  Diagnosis Date   Asthma    as  child   Complication of anesthesia    30 years ago, "awoke prematurely" during dental procedure   Diabetes mellitus without complication (HCC)    GERD (gastroesophageal reflux disease)    Hepatitis C    High cholesterol    Hypertension    Sleep apnea    wears cpap    Past Surgical History:  Procedure Laterality Date   BACK SURGERY     CHOLECYSTECTOMY     COLONOSCOPY      x 3 per pt - all normal - last colon 10 yrs ago either in [place] or [place]   GASTRIC ROUX-EN-Y N/A 11/28/2019   Procedure: LAPAROSCOPIC ROUX-EN-Y GASTRIC BYPASS WITH UPPER ENDOSCOPY, ERAS Pathway;  Surgeon: Kinsinger, De Blanch, MD;  Location: [facility];  Service: General;  Laterality: N/A;   thumb surgery Left    Social History:  reports that he quit smoking about 20 years ago. His smoking use included cigarettes. He has never used smokeless tobacco. He reports current alcohol use. He reports that he does not currently use drugs after having used the following drugs: Marijuana.  No Known Allergies  Family History  Problem Relation Age of Onset   Diabetes Father    Colon cancer Neg Hx    Esophageal cancer Neg Hx    Stomach cancer Neg Hx    Rectal cancer Neg Hx    Colon polyps Neg Hx     Prior to Admission medications   Medication Sig Start Date End Date Taking? Authorizing Provider  atorvastatin (LIPITOR) 40 MG tablet Take 40 mg by mouth at bedtime.  Yes [provider]  lisinopril (ZESTRIL) 40 MG tablet Take 40 mg by mouth at bedtime.   Yes [provider]  amLODipine (NORVASC) 5 MG tablet Take 5 mg by mouth at bedtime.  Patient not taking: Reported on 01/18/2023    [provider]  gabapentin (NEURONTIN) 100 MG capsule Take 2 capsules (200 mg total) by mouth every 12 (twelve) hours. Patient not taking: Reported on 01/18/2023 11/29/19   Kinsinger, De Blanch, MD  gabapentin (NEURONTIN) 300 MG capsule Take 900 mg by mouth 2 (two) times daily as needed (pain).  Patient not taking:  Reported on 01/18/2023    [provider]  hydrALAZINE (APRESOLINE) 25 MG tablet Take 25 mg by mouth 3 (three) times daily as needed (for a B/P >180/100). Patient not taking: Reported on 01/18/2023    [provider]  Insulin Infusion Pump Supplies (PARADIGM RESERVOIR ) MISC 1 Device by Does not apply route daily. 09/01/18   Romero Belling, MD  Insulin Infusion Pump Supplies (QUICK-SET INFUSION 43" ) MISC 1 Device by Does not apply route daily. 09/01/18   Romero Belling, MD    Physical Exam: Vitals:   01/18/23 1000 01/18/23 1001 01/18/23 1003  BP: (!) 162/82    Pulse: (!) 59    Resp:   17  Temp:   98.4 F (36.9 C)  TempSrc:   Oral  SpO2: 98%    Weight:  93.4 kg   Height:  6' (1.829 m)    GENERAL:  A&O x 3, NAD, well developed, cooperative, follows commands HEENT: [place]/AT, No thrush, No icterus, No oral ulcers Neck:  No neck mass, No meningismus, soft, supple CV: RRR, no S3, no S4, no rub, no JVD Lungs:  CTA, no wheeze, no rhonchi, good air movement Abd: soft/NT +BS, nondistended Ext: No edema, no lymphangitis, no cyanosis, no rashes Neuro:  CN II-XII intact, strength 4/5 in RUE,  strength 4/5 LUE, LLE; sensation intact bilateral; no dysmetria; babinski equivocal.  Strength testing on the right lower extremity is limited secondary to pain  Data Reviewed: Data reviewed above in the history  Assessment and Plan: Right inferior and superior pubic rami fractures -Nondisplaced -PT evaluation -Judicious opioids -WBAT  Essential hypertension -Continue amlodipine and lisinopril  Controlled diabetes mellitus type 2  -Holding metformin -01/03/2023 hemoglobin A1c 7.1 -NovoLog sliding scale  Mixed hyperlipidemia -Continue statin  Thrombocytopenia -Appears chronic dating back to 2021 -Monitor for signs of bleeding   Advance Care Planning: FULL  Consults: none  Family Communication: Significant other updated at bedside  Severity of Illness: The appropriate  patient status for this patient is OBSERVATION. Observation status is judged to be reasonable and necessary in order to provide the required intensity of service to ensure the patient's safety. The patient's presenting symptoms, physical exam findings, and initial radiographic and laboratory data in the context of their medical condition is felt to place them at decreased risk for further clinical deterioration. Furthermore, it is anticipated that the patient will be medically stable for discharge from the hospital within 2 midnights of admission.   Author: Catarina Hartshorn, MD 01/18/2023 12:22 PM  For on call review [email or web address].

## 2023-01-18 NOTE — ED Provider Notes (Signed)
[facility] MEDICAL SURGICAL UNIT Provider Note  CSN: [id number] Arrival date & time: 01/18/23 4098  Chief Complaint(s) Fall  HPI Jesse Guerrero is a 72 y.o. male who presents emergency room for evaluation of a fall.  Patient states that this morning he suffered a mechanical fall over his dog's and landed on his back.  He states that immediately following the fall he was unable to get get off the ground secondary to the pain.  He states that his pain is primarily in the inguinal region on the right.  He states that when sitting at rest he is fairly comfortable but he is currently unable to bear weight on the leg secondary to pain.  He denies numbness, tingling, weakness or other neurologic complaints.  Denies head strike or loss of consciousness.  Denies chest pain, shortness of breath, abdominal pain, nausea, vomiting or other systemic or traumatic complaints.   Past Medical History Past Medical History:  Diagnosis Date   Asthma    as child   Complication of anesthesia    30 years ago, "awoke prematurely" during dental procedure   Diabetes mellitus without complication (HCC)    GERD (gastroesophageal reflux disease)    Hepatitis C    High cholesterol    Hypertension    Sleep apnea    wears cpap    Patient Active Problem List   Diagnosis Date Noted   Closed fracture of multiple pubic rami, right, initial encounter (HCC) 01/18/2023   Thrombocytopenia (HCC) 01/18/2023   Morbid obesity (HCC) 11/28/2019   Essential hypertension 06/03/2019   Hyperlipidemia 06/03/2019   Obesity 06/03/2019   Family history of heart disease 06/03/2019   Preoperative clearance 06/03/2019   Diabetes (HCC) 04/08/2018   Home Medication(s) Prior to Admission medications   Medication Sig Start Date End Date Taking? Authorizing Provider  amLODipine (NORVASC) 5 MG tablet Take 5 mg by mouth daily.   Yes [provider]  atorvastatin (LIPITOR) 40 MG tablet Take 40 mg by mouth at bedtime.     Yes [provider]  lisinopril (ZESTRIL) 40 MG tablet Take 40 mg by mouth at bedtime.   Yes [provider]  metFORMIN (GLUCOPHAGE) 500 MG tablet Take 500 mg by mouth 2 (two) times daily with a meal.   Yes [provider]  OVER THE COUNTER MEDICATION Take 1 tablet by mouth daily. Multivitamin with iron for bariatric surgery   Yes [provider]                                                                                                                                    Past Surgical History Past Surgical History:  Procedure Laterality Date   BACK SURGERY     CHOLECYSTECTOMY     COLONOSCOPY      x 3 per pt - all normal - last colon 10 yrs ago either in [place] or [place]  GASTRIC ROUX-EN-Y N/A 11/28/2019   Procedure: LAPAROSCOPIC ROUX-EN-Y GASTRIC BYPASS WITH UPPER ENDOSCOPY, ERAS Pathway;  Surgeon: Kinsinger, De Blanch, MD;  Location: [facility];  Service: General;  Laterality: N/A;   thumb surgery Left    Family History Family History  Problem Relation Age of Onset   Diabetes Father    Colon cancer Neg Hx    Esophageal cancer Neg Hx    Stomach cancer Neg Hx    Rectal cancer Neg Hx    Colon polyps Neg Hx     Social History Social History   Tobacco Use   Smoking status: Former    Types: Cigarettes    Quit date: 2004    Years since quitting: 20.4   Smokeless tobacco: Never   Tobacco comments:    Quit 20 years ago  Vaping Use   Vaping Use: Never used  Substance Use Topics   Alcohol use: Yes    Comment: Occasionally    Drug use: Not Currently    Types: Marijuana    Comment: occassional   Allergies Patient has no known allergies.  Review of Systems Review of Systems  Musculoskeletal:  Positive for arthralgias, back pain and myalgias.    Physical Exam Vital Signs  I have reviewed the triage vital signs BP (!) 165/69 (BP Location: Left Arm)   Pulse (!) 45   Temp 98 F (36.7 C) (Oral)   Resp 13   Ht 6' (1.829 m)   Wt  93.4 kg   SpO2 98%   BMI 27.94 kg/m   Physical Exam Constitutional:      General: He is not in acute distress.    Appearance: Normal appearance.  HENT:     Head: Normocephalic and atraumatic.     Nose: No congestion or rhinorrhea.  Eyes:     General:        Right eye: No discharge.        Left eye: No discharge.     Extraocular Movements: Extraocular movements intact.     Pupils: Pupils are equal, round, and reactive to light.  Cardiovascular:     Rate and Rhythm: Normal rate and regular rhythm.     Heart sounds: No murmur heard. Pulmonary:     Effort: No respiratory distress.     Breath sounds: No wheezing or rales.  Abdominal:     General: There is no distension.     Tenderness: There is no abdominal tenderness.  Musculoskeletal:        General: Tenderness present. Normal range of motion.     Cervical back: Normal range of motion.  Skin:    General: Skin is warm and dry.  Neurological:     General: No focal deficit present.     Mental Status: He is alert.     ED Results and Treatments Labs (all labs ordered are listed, but only abnormal results are displayed) Labs Reviewed  COMPREHENSIVE METABOLIC PANEL - Abnormal; Notable for the following components:      Result Value   Glucose, Bld 172 (*)    Creatinine, Ser 0.60 (*)    Calcium 8.5 (*)    Alkaline Phosphatase 131 (*)    All other components within normal limits  CBC WITH DIFFERENTIAL/PLATELET - Abnormal; Notable for the following components:   Platelets 128 (*)    All other components within normal limits  GLUCOSE, CAPILLARY - Abnormal; Notable for the following components:   Glucose-Capillary 110 (*)    All other components within  normal limits  GLUCOSE, CAPILLARY - Abnormal; Notable for the following components:   Glucose-Capillary 103 (*)    All other components within normal limits  BASIC METABOLIC PANEL  CBC  VITAMIN B12  FOLATE  TSH  T4, FREE                                                                                                                           Radiology DG Lumbar Spine Complete  Result Date: 01/18/2023 CLINICAL DATA:  Larey Seat last night.  Right leg and groin pain. EXAM: LUMBAR SPINE - COMPLETE 4+ VIEW COMPARISON:  None Available. FINDINGS: No fracture, bone lesion or spondylolisthesis. Disc spaces are well maintained. Small anterior endplate osteophytes from the lower thoracic spine through the lumbar spine. Facet joints are well maintained. Scattered aortic atherosclerotic calcifications. IMPRESSION: 1. No fracture.  No malalignment. Electronically Signed   By: Amie Portland M.D.   On: 01/18/2023 10:42   DG Hip Unilat  With Pelvis 2-3 Views Right  Result Date: 01/18/2023 CLINICAL DATA:  Larey Seat last night.  Right leg and groin pain. EXAM: DG HIP (WITH OR WITHOUT PELVIS) 2-3V RIGHT COMPARISON:  None Available. FINDINGS: Linear nondisplaced, non comminuted fracture of the inferior right pubic ramus adjacent to the pubic symphysis. Subtle evidence of a nondisplaced fracture of the superior right pubic ramus nears junction with the medial acetabulum. This is equivocal. No other evidence of a fracture.  No bone lesion. Hip joints, SI joints and pubic symphysis are normally aligned. IMPRESSION: 1. Linear nondisplaced fracture of the inferior right pubic ramus. Possible additional nondisplaced fracture of the superior right pubic ramus adjacent to the medial right acetabulum. 2. No other fractures.  No dislocation. Electronically Signed   By: Amie Portland M.D.   On: 01/18/2023 10:41    Pertinent labs & imaging results that were available during my care of the patient were reviewed by me and considered in my medical decision making (see MDM for details).  Medications Ordered in ED Medications  amLODipine (NORVASC) tablet 5 mg (5 mg Oral Given 01/18/23 1429)  atorvastatin (LIPITOR) tablet 40 mg (has no administration in time range)  lisinopril (ZESTRIL) tablet 40 mg (has no  administration in time range)  morphine (PF) 2 MG/ML injection 0.5 mg (has no administration in time range)  enoxaparin (LOVENOX) injection 40 mg (40 mg Subcutaneous Given 01/18/23 1426)  methocarbamol (ROBAXIN) tablet 500 mg (has no administration in time range)    Or  methocarbamol (ROBAXIN) 500 mg in dextrose 5 % 50 mL IVPB (has no administration in time range)  acetaminophen (TYLENOL) tablet 650 mg (650 mg Oral Given 01/18/23 1429)  oxyCODONE (Oxy IR/ROXICODONE) immediate release tablet 5 mg (5 mg Oral Given 01/18/23 1429)  ketorolac (TORADOL) 15 MG/ML injection 15 mg (15 mg Intramuscular Given 01/18/23 1031)  Procedures Procedures  (including critical care time)  Medical Decision Making / ED Course   This patient presents to the ED for concern of fall, leg pain, this involves an extensive number of treatment options, and is a complaint that carries with it a high risk of complications and morbidity.  The differential diagnosis includes fracture, ligamentous injury, hematoma, contusion, dislocation, groin sprain  MDM: Patient seen emergency room for evaluation of a fall.  Physical exam reveals tenderness in the perineum on the right worse with abduction of the right lower extremity.  Neurologic exam unremarkable.  X-ray imaging is concerning for multiple pubic rami fractures.  Pain controlled in the emergency department but patient is unable to bear weight.  He will require hospital admission for physical therapy and pain control.  Laboratory evaluation largely unremarkable.  Patient then admitted.   Additional history obtained: -Additional history obtained from fianc -External records from outside source obtained and reviewed including: Chart review including previous notes, labs, imaging, consultation notes   Lab Tests: -I ordered, reviewed, and interpreted  labs.   The pertinent results include:   Labs Reviewed  COMPREHENSIVE METABOLIC PANEL - Abnormal; Notable for the following components:      Result Value   Glucose, Bld 172 (*)    Creatinine, Ser 0.60 (*)    Calcium 8.5 (*)    Alkaline Phosphatase 131 (*)    All other components within normal limits  CBC WITH DIFFERENTIAL/PLATELET - Abnormal; Notable for the following components:   Platelets 128 (*)    All other components within normal limits  GLUCOSE, CAPILLARY - Abnormal; Notable for the following components:   Glucose-Capillary 110 (*)    All other components within normal limits  GLUCOSE, CAPILLARY - Abnormal; Notable for the following components:   Glucose-Capillary 103 (*)    All other components within normal limits  BASIC METABOLIC PANEL  CBC  VITAMIN B12  FOLATE  TSH  T4, FREE        Imaging Studies ordered: I ordered imaging studies including x-ray hip, L-spine I independently visualized and interpreted imaging. I agree with the radiologist interpretation   Medicines ordered and prescription drug management: Meds ordered this encounter  Medications   ketorolac (TORADOL) 15 MG/ML injection 15 mg   DISCONTD: oxyCODONE-acetaminophen (PERCOCET/ROXICET) 5-325 MG per tablet 1 tablet   amLODipine (NORVASC) tablet 5 mg   atorvastatin (LIPITOR) tablet 40 mg   lisinopril (ZESTRIL) tablet 40 mg   morphine (PF) 2 MG/ML injection 0.5 mg   enoxaparin (LOVENOX) injection 40 mg   OR Linked Order Group    methocarbamol (ROBAXIN) tablet 500 mg    methocarbamol (ROBAXIN) 500 mg in dextrose 5 % 50 mL IVPB   acetaminophen (TYLENOL) tablet 650 mg   oxyCODONE (Oxy IR/ROXICODONE) immediate release tablet 5 mg    -I have reviewed the patients home medicines and have made adjustments as needed  Critical interventions none   Cardiac Monitoring: The patient was maintained on a cardiac monitor.  I personally viewed and interpreted the cardiac monitored which showed an  underlying rhythm of: NSR  Social Determinants of Health:  Factors impacting patients care include: none   Reevaluation: After the interventions noted above, I reevaluated the patient and found that they have :improved  Co morbidities that complicate the patient evaluation  Past Medical History:  Diagnosis Date   Asthma    as child   Complication of anesthesia    30 years ago, "awoke prematurely" during dental procedure  Diabetes mellitus without complication (HCC)    GERD (gastroesophageal reflux disease)    Hepatitis C    High cholesterol    Hypertension    Sleep apnea    wears cpap       Dispostion: I considered admission for this patient, and due to new pubic rami fractures and inability to bear weight patient will require hospital admission     Final Clinical Impression(s) / ED Diagnoses Final diagnoses:  Closed fracture of multiple pubic rami, right, initial encounter [facility])     @PCDICTATION @    Glendora Score, MD 01/18/23 1737

## 2023-01-19 DIAGNOSIS — D696 Thrombocytopenia, unspecified: Secondary | ICD-10-CM | POA: Diagnosis not present

## 2023-01-19 DIAGNOSIS — S32591A Other specified fracture of right pubis, initial encounter for closed fracture: Secondary | ICD-10-CM | POA: Diagnosis not present

## 2023-01-19 DIAGNOSIS — I10 Essential (primary) hypertension: Secondary | ICD-10-CM | POA: Diagnosis not present

## 2023-01-19 LAB — CBC
HCT: 46.9 % (ref 39.0–52.0)
Hemoglobin: 16 g/dL (ref 13.0–17.0)
MCH: 28.9 pg (ref 26.0–34.0)
MCHC: 34.1 g/dL (ref 30.0–36.0)
MCV: 84.7 fL (ref 80.0–100.0)
Platelets: 125 10*3/uL — ABNORMAL LOW (ref 150–400)
RBC: 5.54 MIL/uL (ref 4.22–5.81)
RDW: 13.2 % (ref 11.5–15.5)
WBC: 6.8 10*3/uL (ref 4.0–10.5)
nRBC: 0 % (ref 0.0–0.2)

## 2023-01-19 LAB — TSH: TSH: 1.484 u[IU]/mL (ref 0.350–4.500)

## 2023-01-19 LAB — T4, FREE: Free T4: 0.8 ng/dL (ref 0.61–1.12)

## 2023-01-19 LAB — BASIC METABOLIC PANEL
Anion gap: 6 (ref 5–15)
BUN: 9 mg/dL (ref 8–23)
CO2: 25 mmol/L (ref 22–32)
Calcium: 8.5 mg/dL — ABNORMAL LOW (ref 8.9–10.3)
Chloride: 105 mmol/L (ref 98–111)
Creatinine, Ser: 0.62 mg/dL (ref 0.61–1.24)
GFR, Estimated: >60 mL/min (ref >60)
Glucose, Bld: 130 mg/dL — ABNORMAL HIGH (ref 70–99)
Potassium: 3.5 mmol/L (ref 3.5–5.1)
Sodium: 136 mmol/L (ref 135–145)

## 2023-01-19 LAB — FOLATE: Folate: 19.4 ng/mL (ref >5.9)

## 2023-01-19 LAB — VITAMIN B12: Vitamin B-12: 541 pg/mL (ref 180–914)

## 2023-01-19 MED ORDER — OXYCODONE HCL 5 MG PO TABS: 5.0000 mg | ORAL_TABLET | ORAL | 0 refills | Status: DC | PRN

## 2023-01-19 MED ORDER — POTASSIUM CHLORIDE CRYS ER 20 MEQ PO TBCR
20.0000 meq | EXTENDED_RELEASE_TABLET | Freq: Once | ORAL | Status: AC
  Administered 2023-01-19: 20 meq via ORAL
  Filled 2023-01-19: qty 1

## 2023-01-19 NOTE — TOC Initial Note (Signed)
Transition of Care [facility]) - Initial/Assessment Note    Patient Details  Name: Jesse Guerrero MRN: [id number] Date of Birth: Dec 19, 1950  Transition of Care [facility]) CM/SW Contact:    Elliot Gault, LCSW Phone Number: 01/19/2023, 11:05 AM  Clinical Narrative:                  Pt here observation status from home. Per MD, pt stable for dc today. Pt prefers return home rather than SNF rehab.   Met with pt at bedside to assess and assist with dc planning. Per pt, he resides with his fiance and will return home with HH at dc. Provided pt with CMS provider options for [facility] and DME. Referred as requested. RW and 3in1 will be given to pt prior to dc. [facility] will call pt to schedule in home visits. Information added to pt's AVS.  There are no other TOC needs for dc.  Expected Discharge Plan: Home w Home Health Services Barriers to Discharge: Barriers Resolved   Patient Goals and CMS Choice Patient states their goals for this hospitalization and ongoing recovery are:: return home CMS Medicare.gov Compare Post Acute Care list provided to:: Patient Choice offered to / list presented to : Patient      Expected Discharge Plan and Services In-house Referral: Clinical Social Work   Post Acute Care Choice: Home Health Living arrangements for the past 2 months: Single Family Home Expected Discharge Date: 01/19/23                         HH Arranged: PT HH Agency: [facility] Date [facility] Agency Contacted: 01/19/23   Representative spoke with at [employer] Agency: Kandee Keen  Prior Living Arrangements/Services Living arrangements for the past 2 months: Single Family Home Lives with:: Significant Other Patient language and need for interpreter reviewed:: Yes Do you feel safe going back to the place where you live?: Yes      Need for Family Participation in Patient Care: Yes (Comment) Care giver support system in place?: Yes (comment)   Criminal Activity/Legal Involvement Pertinent to  Current Situation/Hospitalization: No - Comment as needed  Activities of Daily Living Home Assistive Devices/Equipment: CBG Meter, Eyeglasses ADL Screening (condition at time of admission) Patient's cognitive ability adequate to safely complete daily activities?: Yes Is the patient deaf or have difficulty hearing?: No Does the patient have difficulty seeing, even when wearing glasses/contacts?: No Does the patient have difficulty concentrating, remembering, or making decisions?: No Patient able to express need for assistance with ADLs?: Yes Does the patient have difficulty dressing or bathing?: Yes Independently performs ADLs?: No Communication: Independent Dressing (OT): Needs assistance Is this a change from baseline?: Change from baseline, expected to last >3 days Grooming: Needs assistance Is this a change from baseline?: Change from baseline, expected to last >3 days Feeding: Independent Bathing: Needs assistance Is this a change from baseline?: Change from baseline, expected to last >3 days Toileting: Needs assistance Is this a change from baseline?: Change from baseline, expected to last >3days In/Out Bed: Needs assistance Is this a change from baseline?: Change from baseline, expected to last >3 days Walks in Home: Needs assistance Is this a change from baseline?: Change from baseline, expected to last >3 days Does the patient have difficulty walking or climbing stairs?: Yes Weakness of Legs: Right Weakness of Arms/Hands: None  Permission Sought/Granted Permission sought to share information with : Facility Medical sales representative Permission granted to share information with :  Yes, Verbal Permission Granted     Permission granted to share info w AGENCY: [facility]        Emotional Assessment Appearance:: Appears younger than stated age Attitude/Demeanor/Rapport: Engaged Affect (typically observed): Pleasant Orientation: : Oriented to Self, Oriented to Place, Oriented to   Time, Oriented to Situation Alcohol / Substance Use: Not Applicable Psych Involvement: No (comment)  Admission diagnosis:  Closed fracture of multiple pubic rami, right, initial encounter [facility]) [S32.591A] Patient Active Problem List   Diagnosis Date Noted   Closed fracture of multiple pubic rami, right, initial encounter (HCC) 01/18/2023   Thrombocytopenia (HCC) 01/18/2023   Morbid obesity (HCC) 11/28/2019   Essential hypertension 06/03/2019   Hyperlipidemia 06/03/2019   Obesity 06/03/2019   Family history of heart disease 06/03/2019   Preoperative clearance 06/03/2019   Diabetes (HCC) 04/08/2018   PCP:  [name], MD Pharmacy:   [facility] [address], [place] - [address] [place] [address] Phone: [phone] Fax: [phone]     Social Determinants of Health (SDOH) Social History: SDOH Screenings   Food Insecurity: No Food Insecurity (01/18/2023)  Housing: Low Risk  (01/18/2023)  Transportation Needs: No Transportation Needs (01/18/2023)  Utilities: Not At Risk (01/18/2023)  Depression (PHQ2-9): Low Risk  (06/15/2019)  Tobacco Use: Medium Risk (01/18/2023)   SDOH Interventions:     Readmission Risk Interventions     No data to display

## 2023-01-19 NOTE — Discharge Summary (Addendum)
Physician Discharge Summary   Patient: Jesse Guerrero MRN: [id number] DOB: [birth year]  Admit date:     01/18/2023  Discharge date: 01/19/23  Discharge Physician: Onalee Hua Amare Kontos   PCP: [name], MD   Recommendations at discharge:   Please follow up with primary care provider within 1-2 weeks  Please repeat BMP and CBC in one week     Hospital Course: 72 year old male with a history of hypertension, hyperlipidemia, diabetes mellitus type 2, peripheral neuropathy, presenting with a mechanical fall.  Patient tripped over a chest when tending to his dogs in the morning of 01/18/2023.  He did not lose consciousness.  He denies hitting his head.  He feels like he landed on his upper buttock and lower back.  The patient had difficulty getting up secondary to pain.  As result, he presented for further evaluation and treatment.  The patient had been in usual state of health without any complaints prior to the mechanical fall.  He denies any prodromal symptoms. Patient denies fevers, chills, headache, chest pain, dyspnea, nausea, vomiting, diarrhea, abdominal pain, dysuria, hematuria, hematochezia, and melena.  In the ED, the patient was afebrile and hemodynamically stable with oxygen saturation 99% room air.  WBC 8.5, hemoglobin 15.9, platelets 128,000.  Sodium 136, potassium 3.6, bicarbonate 24, serum creatinine 0.60.  LFTs unremarkable.  X-ray of the pelvis shows a linear nondisplaced fracture of the right inferior pubic ramus.  There was also a possible additional nondisplaced fracture of the superior pubic ramus on the right.  The patient was unable to bear weight secondary to pain.  As a result he was admitted to the hospital for further evaluation and treatment.  At baseline, the patient lives by himself.  He is completely independent with all his ADLs prior to the current injury.  Assessment and Plan: Right inferior and superior pubic rami fractures -Nondisplaced -PT  evaluation>>HHPT -Judicious opioids -WBAT -outpatient follow up with [facility]--referral sent -[facility] appointment with Dr. Mort Sawyers on 02/02/23 at 11AM   Essential hypertension -Continue amlodipine and lisinopril   Controlled diabetes mellitus type 2  -Holding metformin -01/03/2023 hemoglobin A1c 7.1 -NovoLog sliding scale   Mixed hyperlipidemia -Continue statin   Thrombocytopenia -Appears chronic dating back to 2021 -Monitor for signs of bleeding -TSH, B12 and folate are normal  Sinus Bradycardia -personally reviewed EKG and telemetry>>no high grade AV block -asymptomatic -resting HR low 50s         Consultants: none Procedures performed: none  Disposition: Home Diet recommendation:  Carb modified diet DISCHARGE MEDICATION: Allergies as of 01/19/2023   No Known Allergies      Medication List     TAKE these medications    amLODipine 5 MG tablet Commonly known as: NORVASC Take 5 mg by mouth daily.   atorvastatin 40 MG tablet Commonly known as: LIPITOR Take 40 mg by mouth at bedtime.   lisinopril 40 MG tablet Commonly known as: ZESTRIL Take 40 mg by mouth at bedtime.   metFORMIN 500 MG tablet Commonly known as: GLUCOPHAGE Take 500 mg by mouth 2 (two) times daily with a meal.   OVER THE COUNTER MEDICATION Take 1 tablet by mouth daily. Multivitamin with iron for bariatric surgery   oxyCODONE 5 MG immediate release tablet Commonly known as: Oxy IR/ROXICODONE Take 1 tablet (5 mg total) by mouth every 4 (four) hours as needed for moderate pain.               Durable Medical Equipment  (From admission, onward)  Start     Ordered   01/19/23 1023  For home use only DME 3 n 1  Once        01/19/23 1022   01/19/23 1010  For home use only DME Walker rolling  Once       Question Answer Comment  Walker: With 5 Inch Wheels   Patient needs a walker to treat with the following condition Closed fracture of multiple pubic  rami, right, initial encounter (HCC)      01/19/23 1010            Follow-up Information     Care, [facility] Follow up.   Specialty: Home Health Services Why: [facility] staff will call you to schedule in home therapy visits Contact information: [address] [place] [address] [phone]         Vickki Hearing, MD Follow up.   Specialties: Orthopedic Surgery, Radiology Why: 02/02/23 at [employer] information: [address] [place] [address] [phone]                Discharge Exam: Ceasar Mons Weights   01/18/23 1001  Weight: 93.4 kg   HEENT:  Central/AT, No thrush, no icterus CV:  RRR, no rub, no S3, no S4 Lung:  CTA, no wheeze, no rhonchi Abd:  soft/+BS, NT Ext:  No edema, no lymphangitis, no synovitis, no rash   Condition at discharge: stable  The results of significant diagnostics from this hospitalization (including imaging, microbiology, ancillary and laboratory) are listed below for reference.   Imaging Studies: DG Lumbar Spine Complete  Result Date: 01/18/2023 CLINICAL DATA:  Larey Seat last night.  Right leg and groin pain. EXAM: LUMBAR SPINE - COMPLETE 4+ VIEW COMPARISON:  None Available. FINDINGS: No fracture, bone lesion or spondylolisthesis. Disc spaces are well maintained. Small anterior endplate osteophytes from the lower thoracic spine through the lumbar spine. Facet joints are well maintained. Scattered aortic atherosclerotic calcifications. IMPRESSION: 1. No fracture.  No malalignment. Electronically Signed   By: Amie Portland M.D.   On: 01/18/2023 10:42   DG Hip Unilat  With Pelvis 2-3 Views Right  Result Date: 01/18/2023 CLINICAL DATA:  Larey Seat last night.  Right leg and groin pain. EXAM: DG HIP (WITH OR WITHOUT PELVIS) 2-3V RIGHT COMPARISON:  None Available. FINDINGS: Linear nondisplaced, non comminuted fracture of the inferior right pubic ramus adjacent to the pubic symphysis. Subtle evidence of a  nondisplaced fracture of the superior right pubic ramus nears junction with the medial acetabulum. This is equivocal. No other evidence of a fracture.  No bone lesion. Hip joints, SI joints and pubic symphysis are normally aligned. IMPRESSION: 1. Linear nondisplaced fracture of the inferior right pubic ramus. Possible additional nondisplaced fracture of the superior right pubic ramus adjacent to the medial right acetabulum. 2. No other fractures.  No dislocation. Electronically Signed   By: Amie Portland M.D.   On: 01/18/2023 10:41    Microbiology: Results for orders placed or performed during the hospital encounter of 11/24/19  SARS CORONAVIRUS 2 (Mackensey Bolte 6-24 HRS) Nasopharyngeal Nasopharyngeal Swab     Status: None   Collection Time: 11/24/19  9:48 AM   Specimen: Nasopharyngeal Swab  Result Value Ref Range Status   SARS Coronavirus 2 NEGATIVE NEGATIVE Final    Comment: (NOTE) SARS-CoV-2 target nucleic acids are NOT DETECTED. The SARS-CoV-2 RNA is generally detectable in upper and lower respiratory specimens during the acute phase of infection. Negative results do not preclude  SARS-CoV-2 infection, do not rule out co-infections with other pathogens, and should not be used as the sole basis for treatment or other patient management decisions. Negative results must be combined with clinical observations, patient history, and epidemiological information. The expected result is Negative. Fact Sheet for Patients: HairSlick.no Fact Sheet for Healthcare Providers: quierodirigir.com This test is not yet approved or cleared by the [place] FDA and  has been authorized for detection and/or diagnosis of SARS-CoV-2 by FDA under an Emergency Use Authorization (EUA). This EUA will remain  in effect (meaning this test can be used) for the duration of the COVID-19 declaration under Section 56 4(b)(1) of the Act, 21 U.S.C. section 360bbb-3(b)(1),  unless the authorization is terminated or revoked sooner. Performed at [facility], [address] [place], [place] [address]     Labs: CBC: Recent Labs  Lab 01/18/23 1150 01/19/23 0440  WBC 8.5 6.8  NEUTROABS 5.9  --   HGB 15.9 16.0  HCT 45.9 46.9  MCV 84.8 84.7  PLT 128* 125*   Basic Metabolic Panel: Recent Labs  Lab 01/18/23 1150 01/19/23 0440  NA 136 136  K 3.6 3.5  CL 106 105  CO2 24 25  GLUCOSE 172* 130*  BUN 10 9  CREATININE 0.60* 0.62  CALCIUM 8.5* 8.5*   Liver Function Tests: Recent Labs  Lab 01/18/23 1150  AST 22  ALT 28  ALKPHOS 131*  BILITOT 1.0  PROT 6.5  ALBUMIN 3.6   CBG: Recent Labs  Lab 01/18/23 1347 01/18/23 1633  GLUCAP 110* 103*    Discharge time spent: greater than 30 minutes.  Signed: Catarina Hartshorn, MD [facility] 01/19/2023

## 2023-01-19 NOTE — Progress Notes (Signed)
Patient discharged home with instructions given on medications and follow up visits ,patient verbalized understanding . Prescriptions sent to Pharmacy of choice documented ob AVS.IV discontinued ,catheter intact. Accompanied by staff to an awaiting vehicle.

## 2023-01-19 NOTE — Evaluation (Signed)
Physical Therapy Evaluation Patient Details Name: Jesse Guerrero MRN: [id number] DOB: [birth year] Today's Date: 01/19/2023  History of Present Illness  Jesse Guerrero is a 72 year old male with a history of hypertension, hyperlipidemia, diabetes mellitus type 2, peripheral neuropathy, presenting with a mechanical fall.  Patient tripped over a chest when tending to his dogs in the morning of 01/18/2023.  He did not lose consciousness.  He denies hitting his head.  He feels like he landed on his upper buttock and lower back.  The patient had difficulty getting up secondary to pain.  As result, he presented for further evaluation and treatment.  The patient had been in usual state of health without any complaints prior to the mechanical fall.  He denies any prodromal symptoms. Patient denies fevers, chills, headache, chest pain, dyspnea, nausea, vomiting, diarrhea, abdominal pain, dysuria, hematuria, hematochezia, and melena.     In the ED, the patient was afebrile and hemodynamically stable with oxygen saturation 99% room air.  WBC 8.5, hemoglobin 15.9, platelets 128,000.  Sodium 136, potassium 3.6, bicarbonate 24, serum creatinine 0.60.  LFTs unremarkable.  X-ray of the pelvis shows a linear nondisplaced fracture of the right inferior pubic ramus.  There was also a possible additional nondisplaced fracture of the superior pubic ramus on the right.  The patient was unable to bear weight secondary to pain.  As a result he was admitted to the hospital for further evaluation and treatment.     At baseline, the patient lives by himself.  He is completely independent with all his ADLs prior to the current injury.   Clinical Impression  Patient demonstrates labored movement for sitting up at bedside with fair/good return for moving RLE, required use of RW for transfers and walking in room due to increasing hip pain when weightbearing on RLE, able to transfer to/from raised toilet seat in bathroom  safely and tolerated sitting up in chair after therapy.  PLAN:  Patient to be discharged home today and discharged from acute physical therapy to care of nursing for ambulation as tolerated for length of stay with recommendations stated below      Recommendations for follow up therapy are one component of a multi-disciplinary discharge planning process, led by the attending physician.  Recommendations may be updated based on patient status, additional functional criteria and insurance authorization.  Follow Up Recommendations       Assistance Recommended at Discharge Set up Supervision/Assistance  Patient can return home with the following  A little help with walking and/or transfers;A little help with bathing/dressing/bathroom;Help with stairs or ramp for entrance;Assistance with cooking/housework    Equipment Recommendations Rolling walker (2 wheels);BSC/3in1  Recommendations for Other Services       Functional Status Assessment Patient has had a recent decline in their functional status and demonstrates the ability to make significant improvements in function in a reasonable and predictable amount of time.     Precautions / Restrictions Precautions Precautions: Fall Restrictions Weight Bearing Restrictions: No RLE Weight Bearing: Weight bearing as tolerated      Mobility  Bed Mobility Overal bed mobility: Needs Assistance Bed Mobility: Supine to Sit     Supine to sit: Supervision, Min guard     General bed mobility comments: increased time labored movement    Transfers Overall transfer level: Needs assistance Equipment used: Rolling walker (2 wheels) Transfers: Sit to/from Stand, Bed to chair/wheelchair/BSC Sit to Stand: Supervision, Min guard   Step pivot transfers: Supervision, Min guard  General transfer comment: unsteady labored movement    Ambulation/Gait Ambulation/Gait assistance: Supervision, Min guard Gait Distance (Feet): 35 Feet Assistive  device: Rolling walker (2 wheels) Gait Pattern/deviations: Decreased step length - right, Decreased step length - left, Decreased stance time - right, Decreased stride length, Antalgic Gait velocity: decreased     General Gait Details: slow labored cadence with decreased weightbearing on RLE due to increasing hip pain, no loss of balance, limited due to fatigue and right hip pain  Stairs            Wheelchair Mobility    Modified Rankin (Stroke Patients Only)       Balance Overall balance assessment: Needs assistance Sitting-balance support: Feet supported, No upper extremity supported Sitting balance-Leahy Scale: Good Sitting balance - Comments: seated at EOB   Standing balance support: During functional activity, Bilateral upper extremity supported Standing balance-Leahy Scale: Fair Standing balance comment: fair/good using RW                             Pertinent Vitals/Pain Pain Assessment Pain Assessment: Faces Faces Pain Scale: Hurts even more Pain Location: right hip Pain Descriptors / Indicators: Sore, Grimacing, Guarding Pain Intervention(s): Limited activity within patient's tolerance, Monitored during session, Repositioned    Home Living Family/patient expects to be discharged to:: Private residence Living Arrangements: Alone Available Help at Discharge: Family;Available 24 hours/day Type of Home: House Home Access: Stairs to enter Entrance Stairs-Rails: Right Entrance Stairs-Number of Steps: 4   Home Layout: One level Home Equipment: None      Prior Function Prior Level of Function : Independent/Modified Independent;Driving             Mobility Comments: Community ambulator without an AD, drives, works ADLs Comments: Independent     Hand Dominance   Dominant Hand: Right    Extremity/Trunk Assessment   Upper Extremity Assessment Upper Extremity Assessment: Overall WFL for tasks assessed    Lower Extremity  Assessment Lower Extremity Assessment: Generalized weakness;RLE deficits/detail RLE Deficits / Details: grossly -4/5 RLE: Unable to fully assess due to pain RLE Sensation: WNL RLE Coordination: WNL    Cervical / Trunk Assessment Cervical / Trunk Assessment: Normal  Communication   Communication: No difficulties  Cognition Arousal/Alertness: Awake/alert Behavior During Therapy: WFL for tasks assessed/performed Overall Cognitive Status: Within Functional Limits for tasks assessed                                          General Comments      Exercises     Assessment/Plan    PT Assessment All further PT needs can be met in the next venue of care  PT Problem List Decreased strength;Decreased activity tolerance;Decreased balance;Decreased mobility;Pain       PT Treatment Interventions      PT Goals (Current goals can be found in the Care Plan section)  Acute Rehab PT Goals Patient Stated Goal: return home with family to assist PT Goal Formulation: With patient Time For Goal Achievement: 01/19/23 Potential to Achieve Goals: Good    Frequency       Co-evaluation               AM-PAC PT "6 Clicks" Mobility  Outcome Measure Help needed turning from your back to your side while in a flat bed without using bedrails?: None Help needed  moving from lying on your back to sitting on the side of a flat bed without using bedrails?: A Little Help needed moving to and from a bed to a chair (including a wheelchair)?: A Little Help needed standing up from a chair using your arms (e.g., wheelchair or bedside chair)?: A Little Help needed to walk in hospital room?: A Little Help needed climbing 3-5 steps with a railing? : A Lot 6 Click Score: 18    End of Session   Activity Tolerance: Patient tolerated treatment well;Patient limited by fatigue Patient left: in chair;with call bell/phone within reach Nurse Communication: Mobility status PT Visit Diagnosis:  Unsteadiness on feet (R26.81);Other abnormalities of gait and mobility (R26.89);Muscle weakness (generalized) (M62.81)    Time: 6578-4696 PT Time Calculation (min) (ACUTE ONLY): 37 min   Charges:   PT Evaluation $PT Eval Moderate Complexity: 1 Mod PT Treatments $Therapeutic Activity: 23-37 mins        1:34 PM, 01/19/23 Ocie Bob, MPT Physical Therapist with [facility] [phone] office [phone] mobile phone

## 2023-01-19 NOTE — Progress Notes (Signed)
Patient has right inferior and superior pubic rami fractures. He is confined to one room and will need 3in1 for home use.

## 2023-01-19 NOTE — Progress Notes (Signed)
Patient felt full sensation during neurovascular checks and pulse was moderate in all locations. Patient did not complain of pain, he did want to wait for PT/OT consultation before ambulating. Stated that he came to hospital with crutches but was told to take home, he will need assistive devices. He will need pain medication before weight bearing exercise on affected legs, stated that's the only time he feels pain. Pulse remains in 40s-50s, through out the night.

## 2023-01-30 ENCOUNTER — Ambulatory Visit: Payer: Medicare HMO | Admitting: Orthopedic Surgery

## 2023-01-30 DIAGNOSIS — E663 Overweight: Secondary | ICD-10-CM | POA: Insufficient documentation

## 2023-01-30 DIAGNOSIS — Z713 Dietary counseling and surveillance: Secondary | ICD-10-CM | POA: Insufficient documentation

## 2023-02-02 ENCOUNTER — Ambulatory Visit: Payer: Medicare HMO | Admitting: Orthopedic Surgery

## 2023-02-02 ENCOUNTER — Encounter: Payer: Self-pay | Admitting: Orthopedic Surgery

## 2023-02-02 VITALS — BP 156/79 | HR 52 | Ht 72.0 in | Wt 206.0 lb

## 2023-02-02 DIAGNOSIS — E119 Type 2 diabetes mellitus without complications: Secondary | ICD-10-CM | POA: Insufficient documentation

## 2023-02-02 DIAGNOSIS — S32591A Other specified fracture of right pubis, initial encounter for closed fracture: Secondary | ICD-10-CM

## 2023-02-02 NOTE — Patient Instructions (Signed)
Let us know which agency is to be coming out for home health, and we can contact them to check status [phone]

## 2023-02-02 NOTE — Progress Notes (Signed)
NEW PROBLEM//OFFICE VISIT   Chief Complaint  Patient presents with   Pelvic Pain    After injury  on 01/17/23   72 year old male fell backwards at his home versus hospital therapy Walk weightbearing as needed oxycodone physical therapy appointment the therapist has not seen him yet they have canceled twice he is ambulatory with a walker he does have some groin pain but is not requiring opioid therapy at this time      ROS: confusion with oxycodone   No Known Allergies  Current Outpatient Medications  Medication Instructions   amLODipine (NORVASC) 5 mg, Oral, Daily   atorvastatin (LIPITOR) 40 mg, Oral, Daily at bedtime   calcium carbonate (OS-CAL - DOSED IN MG OF ELEMENTAL CALCIUM) 1250 (500 Ca) MG tablet 1 tablet, Oral   cholecalciferol (VITAMIN D3) 1,000 Units, Oral, Daily   lisinopril (ZESTRIL) 40 mg, Oral, Daily at bedtime   metFORMIN (GLUCOPHAGE) 500 mg, Oral, 2 times daily with meals   metoprolol tartrate (LOPRESSOR) 25 mg, Oral, Daily   OVER THE COUNTER MEDICATION 1 tablet, Oral, Daily, Multivitamin with iron for bariatric surgery   oxyCODONE (OXY IR/ROXICODONE) 5 mg, Oral, Every 4 hours PRN       BP (!) 156/79   Pulse (!) 52   Ht 6' (1.829 m)   Wt 206 lb (93.4 kg)   BMI 27.94 kg/m   Body mass index is 27.94 kg/m.  General appearance: Well-developed well-nourished no gross deformities  Cardiovascular normal pulse and perfusion normal color without edema  Neurologically no sensation loss or deficits or pathologic reflexes  Psychological: Awake alert and oriented x3 mood and affect normal  Skin no lacerations or ulcerations no nodularity no palpable masses, no erythema or nodularity  Musculoskeletal:  Consistent with his fracture obesity groin pain only mild discomfort passive range of motion of the hip  Leg lengths are equal      Past Medical History:  Diagnosis Date   Asthma    as child   Complication of anesthesia    30 years ago, "awoke  prematurely" during dental procedure   Diabetes mellitus without complication (HCC)    GERD (gastroesophageal reflux disease)    Hepatitis C    High cholesterol    Hypertension    Sleep apnea    wears cpap     Past Surgical History:  Procedure Laterality Date   BACK SURGERY     CHOLECYSTECTOMY     COLONOSCOPY      x 3 per pt - all normal - last colon 10 yrs ago either in [place] or [place]   GASTRIC ROUX-EN-Y N/A 11/28/2019   Procedure: LAPAROSCOPIC ROUX-EN-Y GASTRIC BYPASS WITH UPPER ENDOSCOPY, ERAS Pathway;  Surgeon: Kinsinger, De Blanch, MD;  Location: [facility];  Service: General;  Laterality: N/A;   thumb surgery Left     Family History  Problem Relation Age of Onset   Diabetes Father    Colon cancer Neg Hx    Esophageal cancer Neg Hx    Stomach cancer Neg Hx    Rectal cancer Neg Hx    Colon polyps Neg Hx    Social History   Tobacco Use   Smoking status: Former    Types: Cigarettes    Quit date: 2004    Years since quitting: 20.4   Smokeless tobacco: Never   Tobacco comments:    Quit 20 years ago  Vaping Use   Vaping Use: Never used  Substance Use Topics   Alcohol use: Yes  Comment: Occasionally    Drug use: Not Currently    Types: Marijuana    Comment: occassional    No Known Allergies  Current Meds  Medication Sig   amLODipine (NORVASC) 5 MG tablet Take 5 mg by mouth daily.   atorvastatin (LIPITOR) 40 MG tablet Take 40 mg by mouth at bedtime.    calcium carbonate (OS-CAL - DOSED IN MG OF ELEMENTAL CALCIUM) 1250 (500 Ca) MG tablet Take 1 tablet by mouth.   cholecalciferol (VITAMIN D3) 25 MCG (1000 UNIT) tablet Take 1,000 Units by mouth daily.   lisinopril (ZESTRIL) 40 MG tablet Take 40 mg by mouth at bedtime.   metFORMIN (GLUCOPHAGE) 500 MG tablet Take 500 mg by mouth 2 (two) times daily with a meal.   metoprolol tartrate (LOPRESSOR) 25 MG tablet Take 25 mg by mouth daily.   OVER THE COUNTER MEDICATION Take 1 tablet by mouth daily. Multivitamin  with iron for bariatric surgery   oxyCODONE (OXY IR/ROXICODONE) 5 MG immediate release tablet Take 1 tablet (5 mg total) by mouth every 4 (four) hours as needed for moderate pain.     MEDICAL DECISION MAKING  A.  Encounter Diagnosis  Name Primary?   Closed fracture of ramus of right pubis, initial encounter (HCC) Yes    B. DATA ANALYSED:   IMAGING: Interpretation of images: I have personally reviewed the images and my interpretation is  Orders: The x-ray shows superior and inferior pubic ramus fracture nondisplaced right hemipelvis  Outside records reviewed: Discharge summary   C. MANAGEMENT   Weightbearing as tolerated x-ray in 6 weeks  No orders of the defined types were placed in this encounter.    Fuller Canada, MD  02/02/2023 10:43 AM

## 2023-03-16 ENCOUNTER — Other Ambulatory Visit (INDEPENDENT_AMBULATORY_CARE_PROVIDER_SITE_OTHER): Payer: Medicare HMO

## 2023-03-16 ENCOUNTER — Ambulatory Visit (INDEPENDENT_AMBULATORY_CARE_PROVIDER_SITE_OTHER): Payer: Medicare HMO | Admitting: Orthopedic Surgery

## 2023-03-16 VITALS — BP 175/80 | HR 67

## 2023-03-16 DIAGNOSIS — S32591A Other specified fracture of right pubis, initial encounter for closed fracture: Secondary | ICD-10-CM

## 2023-03-16 NOTE — Progress Notes (Signed)
   This is a follow-up appointment  Encounter Diagnosis  Name Primary?   Closed fracture of ramus of right pubis, initial encounter [facility]) Yes     Chief Complaint  Patient presents with   Routine Post Op    Right pubis fracture 01/18/23     Jesse Guerrero is 6 weeks out from his right inferior pubic ramus fracture.  He is doing well he is playing golf he is back to normal activity.  He walks with no assistive device  His hip exam was normal  His x-ray shows callus around the fracture site  Impression healed right inferior pubic ramus fracture  Plan continue with normal activities follow-up as needed

## 2023-07-03 ENCOUNTER — Encounter (HOSPITAL_COMMUNITY): Payer: Self-pay | Admitting: *Deleted

## 2023-09-22 ENCOUNTER — Emergency Department (HOSPITAL_COMMUNITY): Payer: Medicare Other

## 2023-09-22 ENCOUNTER — Emergency Department (HOSPITAL_COMMUNITY)
Admission: EM | Admit: 2023-09-22 | Discharge: 2023-09-22 | Discharge status: Against Medical Advice | Payer: Medicare Other | Attending: Emergency Medicine | Admitting: Emergency Medicine

## 2023-09-22 ENCOUNTER — Other Ambulatory Visit: Payer: Self-pay

## 2023-09-22 ENCOUNTER — Encounter (HOSPITAL_COMMUNITY): Payer: Self-pay

## 2023-09-22 DIAGNOSIS — R0602 Shortness of breath: Secondary | ICD-10-CM | POA: Diagnosis present

## 2023-09-22 DIAGNOSIS — R059 Cough, unspecified: Secondary | ICD-10-CM | POA: Insufficient documentation

## 2023-09-22 DIAGNOSIS — Z5321 Procedure and treatment not carried out due to patient leaving prior to being seen by health care provider: Secondary | ICD-10-CM | POA: Diagnosis not present

## 2023-09-22 LAB — BASIC METABOLIC PANEL
Anion gap: 10 (ref 5–15)
BUN: 16 mg/dL (ref 8–23)
CO2: 27 mmol/L (ref 22–32)
Calcium: 9.2 mg/dL (ref 8.9–10.3)
Chloride: 101 mmol/L (ref 98–111)
Creatinine, Ser: 0.72 mg/dL (ref 0.61–1.24)
GFR, Estimated: >60 mL/min (ref >60)
Glucose, Bld: 157 mg/dL — ABNORMAL HIGH (ref 70–99)
Potassium: 4.1 mmol/L (ref 3.5–5.1)
Sodium: 138 mmol/L (ref 135–145)

## 2023-09-22 LAB — CBC WITH DIFFERENTIAL/PLATELET
Abs Immature Granulocytes: 0.04 10*3/uL (ref 0.00–0.07)
Basophils Absolute: 0.1 10*3/uL (ref 0.0–0.1)
Basophils Relative: 1 %
Eosinophils Absolute: 0.1 10*3/uL (ref 0.0–0.5)
Eosinophils Relative: 2 %
HCT: 51 % (ref 39.0–52.0)
Hemoglobin: 17.8 g/dL — ABNORMAL HIGH (ref 13.0–17.0)
Immature Granulocytes: 1 %
Lymphocytes Relative: 23 %
Lymphs Abs: 2 10*3/uL (ref 0.7–4.0)
MCH: 29.8 pg (ref 26.0–34.0)
MCHC: 34.9 g/dL (ref 30.0–36.0)
MCV: 85.3 fL (ref 80.0–100.0)
Monocytes Absolute: 1.4 10*3/uL — ABNORMAL HIGH (ref 0.1–1.0)
Monocytes Relative: 16 %
Neutro Abs: 5.1 10*3/uL (ref 1.7–7.7)
Neutrophils Relative %: 57 %
Platelets: 189 10*3/uL (ref 150–400)
RBC: 5.98 MIL/uL — ABNORMAL HIGH (ref 4.22–5.81)
RDW: 13.2 % (ref 11.5–15.5)
WBC: 8.9 10*3/uL (ref 4.0–10.5)
nRBC: 0 % (ref 0.0–0.2)

## 2023-09-22 NOTE — ED Notes (Signed)
Called for Pt from Waiting room X 3. No answer.

## 2023-09-22 NOTE — ED Triage Notes (Signed)
 Pt arrived via POV from home c/o testing positive for the Flu this past Thursday. Pt reports strong productive cough, and being unable to sleep. Pt reports his PCP prescribed him medication, but Pt reports needing a chest xray and blood work to be performed.

## 2023-10-19 ENCOUNTER — Inpatient Hospital Stay

## 2023-10-19 VITALS — BP 134/69 | HR 100 | Temp 97.7°F | Resp 16 | Wt 215.0 lb

## 2023-10-19 DIAGNOSIS — G4730 Sleep apnea, unspecified: Secondary | ICD-10-CM | POA: Diagnosis not present

## 2023-10-19 DIAGNOSIS — D2260 Melanocytic nevi of unspecified upper limb, including shoulder: Secondary | ICD-10-CM | POA: Diagnosis present

## 2023-10-19 DIAGNOSIS — E119 Type 2 diabetes mellitus without complications: Secondary | ICD-10-CM | POA: Insufficient documentation

## 2023-10-19 DIAGNOSIS — Z7984 Long term (current) use of oral hypoglycemic drugs: Secondary | ICD-10-CM | POA: Insufficient documentation

## 2023-10-19 DIAGNOSIS — C439 Malignant melanoma of skin, unspecified: Secondary | ICD-10-CM | POA: Diagnosis not present

## 2023-10-19 DIAGNOSIS — Z87891 Personal history of nicotine dependence: Secondary | ICD-10-CM | POA: Insufficient documentation

## 2023-10-19 DIAGNOSIS — Z79899 Other long term (current) drug therapy: Secondary | ICD-10-CM | POA: Diagnosis not present

## 2023-10-19 DIAGNOSIS — K219 Gastro-esophageal reflux disease without esophagitis: Secondary | ICD-10-CM | POA: Insufficient documentation

## 2023-10-19 DIAGNOSIS — I10 Essential (primary) hypertension: Secondary | ICD-10-CM | POA: Insufficient documentation

## 2023-10-19 NOTE — Progress Notes (Signed)
 [name] CONSULT NOTE  Patient Care Team: [name], MD as PCP - General (Internal Medicine)  ASSESSMENT & PLAN:  Jesse Guerrero is a 73 y.o.male with history of diabetes, hypertension, hepatitis C, sleep apnea being [facility].  Clinical impression patient is concerning for metastatic melanoma.  Discussed with pathology, biopsy finding is concerning for metastatic lesion from another site of melanoma.  Patient denies personal history of known melanoma.  Primary is not clear. Discussed concerning findings of melanoma with metastases based on pathology findings.  Recommend proceed with imaging for staging and finding cancer of unknown origin.  Follow up with me to discuss result in 1-2 weeks.  Orders Placed This Encounter  Procedures   NM PET Image Initial (PI) Whole Body    Standing Status:   Future    Expected Date:   10/23/2023    Expiration Date:   10/18/2024    If indicated for the ordered procedure, I authorize the administration of a radiopharmaceutical per Radiology protocol:   Yes    Preferred imaging location?:   [name]   All questions were answered. The patient knows to call the clinic with any problems, questions or concerns. No barriers to learning was detected.  [name], MD 3/3/20254:12 PM  CHIEF COMPLAINTS/PURPOSE OF CONSULTATION:  Melanoma  HISTORY OF PRESENTING ILLNESS:  Jesse Guerrero 73 y.o. male is here because of melanoma.  Available records from [facility] in [place] reviewed.  Patient was seen in October 07, 2023 in which shave biopsy was performed on the skin lesions over right medial upper back and left proximal posterior arm.  Report showed malignant melanoma of the right medial upper back.  Junctional dysplastic nevus with moderate atypia on the left proximal posterior upper arm.   I spoke to the pathologist, Dr. [name].  The deep dermis involved metastatic melanoma and most  likely of a metastatic lesion instead of a primary. There was no epidermis submitted.  Outside pathology report did not show specific Breslow thickness or T stage.   I have reviewed his chart and materials related to his cancer extensively and collaborated history with the patient. Summary of oncologic history is as follows: Oncology History   No history exists.    MEDICAL HISTORY:  Past Medical History:  Diagnosis Date   Asthma    as child   Complication of anesthesia    30 years ago, "awoke prematurely" during dental procedure   Diabetes mellitus without complication (HCC)    GERD (gastroesophageal reflux disease)    Hepatitis C    High cholesterol    Hypertension    Sleep apnea    wears cpap     SURGICAL HISTORY: Past Surgical History:  Procedure Laterality Date   BACK SURGERY     CHOLECYSTECTOMY     COLONOSCOPY      x 3 per pt - all normal - last colon 10 yrs ago either in [place] or [place]   GASTRIC ROUX-EN-Y N/A 11/28/2019   Procedure: LAPAROSCOPIC ROUX-EN-Y GASTRIC BYPASS WITH UPPER ENDOSCOPY, ERAS Pathway;  Surgeon: Kinsinger, De Blanch, MD;  Location: [facility];  Service: General;  Laterality: N/A;   thumb surgery Left     SOCIAL HISTORY: Social History   Socioeconomic History   Marital status: Widowed    Spouse name: Not on file   Number of children: Not on file   Years of education: Not on file   Highest education level: Not  on file  Occupational History   Not on file  Tobacco Use   Smoking status: Former    Current packs/day: 0.00    Types: Cigarettes    Quit date: 2004    Years since quitting: 21.1   Smokeless tobacco: Never   Tobacco comments:    Quit 20 years ago  Vaping Use   Vaping status: Never Used  Substance and Sexual Activity   Alcohol use: Yes    Comment: Occasionally    Drug use: Not Currently    Types: Marijuana    Comment: occassional   Sexual activity: Not on file  Other Topics Concern   Not on file  Social History  Narrative   Not on file   Social Drivers of Health   Financial Resource Strain: Not on file  Food Insecurity: No Food Insecurity (01/18/2023)   Hunger Vital Sign    Worried About Running Out of Food in the Last Year: Never true    Ran Out of Food in the Last Year: Never true  Transportation Needs: No Transportation Needs (01/18/2023)   PRAPARE - Administrator, Civil Service (Medical): No    Lack of Transportation (Non-Medical): No  Physical Activity: Not on file  Stress: Not on file  Social Connections: Not on file  Intimate Partner Violence: Not At Risk (01/18/2023)   Humiliation, Afraid, Rape, and Kick questionnaire    Fear of Current or Ex-Partner: No    Emotionally Abused: No    Physically Abused: No    Sexually Abused: No    FAMILY HISTORY: Family History  Problem Relation Age of Onset   Diabetes Father    Colon cancer Neg Hx    Esophageal cancer Neg Hx    Stomach cancer Neg Hx    Rectal cancer Neg Hx    Colon polyps Neg Hx     ALLERGIES:  has no known allergies.  MEDICATIONS:  Current Outpatient Medications  Medication Sig Dispense Refill   amLODipine (NORVASC) 5 MG tablet Take 10 mg by mouth daily.     lisinopril (ZESTRIL) 40 MG tablet Take 40 mg by mouth at bedtime.     metFORMIN (GLUCOPHAGE) 500 MG tablet Take 500 mg by mouth 2 (two) times daily with a meal.     metoprolol tartrate (LOPRESSOR) 25 MG tablet Take 25 mg by mouth daily.     oseltamivir (TAMIFLU) 75 MG capsule Take 75 mg by mouth 2 (two) times daily.     OVER THE COUNTER MEDICATION Take 1 tablet by mouth daily. Multivitamin with iron for bariatric surgery     No current facility-administered medications for this visit.    REVIEW OF SYSTEMS:   All relevant systems were reviewed with the patient and are negative.  PHYSICAL EXAMINATION: ECOG PERFORMANCE STATUS: 0 - Asymptomatic  Vitals:   10/19/23 1203  BP: 134/69  Pulse: 100  Resp: 16  Temp: 97.7 F (36.5 C)   Filed Weights    10/19/23 1203  Weight: 215 lb (97.5 kg)    GENERAL: alert, no distress and comfortable SKIN: Multiple moles over the trunk and back.  Scar over the previous excision site. EYES: sclera clear OROPHARYNX: no exudate, no erythema NECK: supple LYMPH:  no palpable lymphadenopathy in the cervical, axillary regions LUNGS: Effort normal, no respiratory distress.  Clear to auscultation bilaterally HEART: regular rate & rhythm and no lower extremity edema ABDOMEN: soft, non-tender and nondistended Musculoskeletal: no point tenderness NEURO: no focal motor/sensory deficits  LABORATORY  DATA:  I have reviewed the data as listed Lab Results  Component Value Date   WBC 8.9 09/22/2023   HGB 17.8 (H) 09/22/2023   HCT 51.0 09/22/2023   MCV 85.3 09/22/2023   PLT 189 09/22/2023   Recent Labs    01/18/23 1150 01/19/23 0440 09/22/23 1259  NA 136 136 138  K 3.6 3.5 4.1  CL 106 105 101  CO2 24 25 27   GLUCOSE 172* 130* 157*  BUN 10 9 16   CREATININE 0.60* 0.62 0.72  CALCIUM 8.5* 8.5* 9.2  GFRNONAA >60 >60 >60  PROT 6.5  --   --   ALBUMIN 3.6  --   --   AST 22  --   --   ALT 28  --   --   ALKPHOS 131*  --   --   BILITOT 1.0  --   --     RADIOGRAPHIC STUDIES: I have personally reviewed the radiological images as listed and agreed with the findings in the report. DG Chest 2 View Result Date: 09/22/2023 CLINICAL DATA:  Shortness of breath EXAM: CHEST - 2 VIEW COMPARISON:  06/07/2019 FINDINGS: Heart and mediastinal contours are within normal limits. No focal opacities or effusions. No acute bony abnormality. Peribronchial thickening. IMPRESSION: Bronchitic changes. Electronically Signed   By: Charlett Nose M.D.   On: 09/22/2023 12:44

## 2023-10-22 ENCOUNTER — Telehealth: Payer: Self-pay

## 2023-10-22 ENCOUNTER — Telehealth: Payer: Self-pay | Admitting: *Deleted

## 2023-10-22 ENCOUNTER — Ambulatory Visit (HOSPITAL_COMMUNITY)
Admission: RE | Admit: 2023-10-22 | Discharge: 2023-10-22 | Disposition: A | Discharge status: Home / Self Care | Source: Ambulatory Visit

## 2023-10-22 DIAGNOSIS — C439 Malignant melanoma of skin, unspecified: Secondary | ICD-10-CM

## 2023-10-22 MED ORDER — FLUDEOXYGLUCOSE F - 18 (FDG) INJECTION
10.3100 | Freq: Once | INTRAVENOUS | Status: AC | PRN
  Administered 2023-10-22: 10.31 via INTRAVENOUS

## 2023-10-22 NOTE — Telephone Encounter (Signed)
 Pt called to cancel all of his appts. States his daughter in law (?) is a NP and she says it doesn't take 2 weeks to read a scan. Is going to transfer to [facility]. They are going to see him tomorrow. Is asking for scan to be power pushed so [facility] can see it.   Explained that we have been asked to allow 7-10 days for scans to be read by Radiology. RN will contact radiology to power push scan.

## 2023-10-22 NOTE — Telephone Encounter (Signed)
 Patient states that he would like to transfer his care to [facility]. Cancelled patients appointment and transferred patient to the nurse on duty to power push his file to [facility].

## 2023-11-02 ENCOUNTER — Inpatient Hospital Stay
